# Patient Record
Sex: Female | Born: 1960 | Race: White | Hispanic: No | Marital: Married | Smoking: Current every day smoker
Health system: Southern US, Community
[De-identification: ages and names within clinical notes are randomized; demographics above are authoritative.]

## PROBLEM LIST (undated history)

## (undated) DIAGNOSIS — E7800 Pure hypercholesterolemia, unspecified: Secondary | ICD-10-CM

## (undated) HISTORY — PX: ABDOMINAL HYSTERECTOMY: SHX81

## (undated) HISTORY — PX: APPENDECTOMY: SHX54

## (undated) HISTORY — PX: TONSILLECTOMY: SUR1361

---

## 2013-01-08 DIAGNOSIS — F17210 Nicotine dependence, cigarettes, uncomplicated: Secondary | ICD-10-CM | POA: Insufficient documentation

## 2017-09-20 ENCOUNTER — Other Ambulatory Visit: Payer: Self-pay

## 2017-09-20 ENCOUNTER — Emergency Department (HOSPITAL_BASED_OUTPATIENT_CLINIC_OR_DEPARTMENT_OTHER)
Admission: EM | Admit: 2017-09-20 | Discharge: 2017-09-20 | Disposition: A | Discharge status: Home / Self Care | Payer: 59 | Attending: Emergency Medicine | Admitting: Emergency Medicine

## 2017-09-20 ENCOUNTER — Encounter (HOSPITAL_BASED_OUTPATIENT_CLINIC_OR_DEPARTMENT_OTHER): Payer: Self-pay | Admitting: Emergency Medicine

## 2017-09-20 DIAGNOSIS — S39012A Strain of muscle, fascia and tendon of lower back, initial encounter: Secondary | ICD-10-CM | POA: Diagnosis not present

## 2017-09-20 DIAGNOSIS — S3992XA Unspecified injury of lower back, initial encounter: Secondary | ICD-10-CM | POA: Diagnosis present

## 2017-09-20 DIAGNOSIS — F17200 Nicotine dependence, unspecified, uncomplicated: Secondary | ICD-10-CM | POA: Diagnosis not present

## 2017-09-20 DIAGNOSIS — Y999 Unspecified external cause status: Secondary | ICD-10-CM | POA: Insufficient documentation

## 2017-09-20 DIAGNOSIS — Y939 Activity, unspecified: Secondary | ICD-10-CM | POA: Diagnosis not present

## 2017-09-20 DIAGNOSIS — X58XXXA Exposure to other specified factors, initial encounter: Secondary | ICD-10-CM | POA: Diagnosis not present

## 2017-09-20 DIAGNOSIS — Y929 Unspecified place or not applicable: Secondary | ICD-10-CM | POA: Diagnosis not present

## 2017-09-20 MED ORDER — DIAZEPAM 5 MG PO TABS: 5.0000 mg | ORAL_TABLET | Freq: Two times a day (BID) | ORAL | 0 refills | Status: DC | PRN

## 2017-09-20 NOTE — Discharge Instructions (Signed)
TAKE IBUPROFEN 800MG  EVERY 8 HOURS FOR THE NEXT 3-4 DAYS. TAKE WITH FOOD. TAKE TYLENOL 1000MG  EVERY 6 TO 8 HOURS FOR PAIN. USE VALIUM AS NEEDED FOR MUSCLE RELAXANT, BE CAREFUL WITH THIS MEDICATION AS IT CAUSES SLEEPINESS. RETURN TO ER IF ANY LEG WEAKNESS/NUMBNESS, BOWEL/BLADDER INCONTINENCE, OR NUMBNESS IN GROIN.

## 2017-09-20 NOTE — ED Provider Notes (Signed)
[facility] EMERGENCY DEPARTMENT Provider Note   CSN: [id number] Arrival date & time: 09/20/17  [id number]     History   Chief Complaint Chief Complaint  Patient presents with  . Back Pain    HPI Jenny Bartlett is a 57 y.o. female.  57 year old female who presents with low back pain.  Patient has a distant history of back injury and states that once or twice a year she aggravates her back.  Her family has sent we moved here and yesterday she was moving boxes.  She reached for something and had sudden onset of pain in her lower back similar to previous episodes of back pain.  Pain is across her entire lower back, is crampy, and is severe at times.  She has been taking ibuprofen, Doan's without relief. She did try topical Salonpa's yesterday.  Today she was having some tingling in her right leg but this has resolved today.  She denies any leg weakness, saddle anesthesia, or bowel/bladder incontinence.  Pain is worse with certain movements.  She denies any fevers or recent illness.  No history of kidney problems or cancer.  No IV drug use.  The history is provided by the patient.  Back Pain      History reviewed. No pertinent past medical history.  There are no active problems to display for this patient.   Past Surgical History:  Procedure Laterality Date  . ABDOMINAL HYSTERECTOMY    . APPENDECTOMY    . TONSILLECTOMY       OB History   None      Home Medications    Prior to Admission medications   Medication Sig Start Date End Date Taking? Authorizing Provider  diazepam (VALIUM) 5 MG tablet Take 1 tablet (5 mg total) by mouth every 12 (twelve) hours as needed for muscle spasms. 09/20/17   Braleigh Massoud, Ambrose Finlandachel Morgan, MD    Family History No family history on file.  Social History Social History   Tobacco Use  . Smoking status: Current Every Day Smoker  . Smokeless tobacco: Never Used  Substance Use Topics  . Alcohol use: Not Currently    Frequency: Never  .  Drug use: Never     Allergies   Patient has no known allergies.   Review of Systems Review of Systems  Musculoskeletal: Positive for back pain.   All other systems reviewed and are negative except that which was mentioned in HPI   Physical Exam Updated Vital Signs BP (!) 153/81 (BP Location: Right Arm)   Pulse (!) 102   Temp 98.9 F (37.2 C) (Oral)   Resp 20   Ht 5\' 7"  (1.702 m)   Wt 104.3 kg (230 lb)   SpO2 99%   BMI 36.02 kg/m   Physical Exam  Constitutional: She is oriented to person, place, and time. She appears well-developed and well-nourished. No distress.  HENT:  Head: Normocephalic and atraumatic.  Moist mucous membranes  Eyes: Conjunctivae are normal.  Neck: Neck supple.  Cardiovascular: Normal rate, regular rhythm and normal heart sounds.  No murmur heard. Pulmonary/Chest: Effort normal and breath sounds normal.  Abdominal: Soft. Bowel sounds are normal. She exhibits no distension. There is no tenderness.  Musculoskeletal: She exhibits no edema.  Tenderness and tightness of b/l lumbar paraspinal muscles, no midline pain or stepoff  Neurological: She is alert and oriented to person, place, and time. She displays normal reflexes. No sensory deficit.  Fluent speech 5/5 strength BLE  Skin: Skin is warm and  dry.  Psychiatric: She has a normal mood and affect. Judgment normal.  Nursing note and vitals reviewed.    ED Treatments / Results  Labs (all labs ordered are listed, but only abnormal results are displayed) Labs Reviewed - No data to display  EKG None  Radiology No results found.  Procedures Procedures (including critical care time)  Medications Ordered in ED Medications - No data to display   Initial Impression / Assessment and Plan / ED Course  I have reviewed the triage vital signs and the nursing notes.      Patient demonstrates no lower extremity weakness, saddle anesthesia, bowel or bladder incontinence, or any other  neurologic deficits concerning for cauda equina. No fevers or other infectious symptoms to suggest by the patient's back pain is due to an infection. I have reviewed return precautions, including the development of any of these signs or symptoms, and the patient has voiced understanding. I reviewed supportive care instructions, including NSAIDs, early range of motion exercises, and PCP follow-up if symptoms do not improve for referral to physical therapy. Patient voiced understanding and was discharged in satisfactory condition.   Final Clinical Impressions(s) / ED Diagnoses   Final diagnoses:  Strain of lumbar region, initial encounter    ED Discharge Orders        Ordered    diazepam (VALIUM) 5 MG tablet  Every 12 hours PRN     09/20/17 1009       Luigi Stuckey, Ambrose [place], MD 09/20/17 1014

## 2017-09-20 NOTE — ED Triage Notes (Signed)
Low back pain since yesterday after reaching for something. Hx of back problems.

## 2017-10-30 ENCOUNTER — Ambulatory Visit: Payer: 59 | Admitting: Family Medicine

## 2017-10-30 DIAGNOSIS — Z0289 Encounter for other administrative examinations: Secondary | ICD-10-CM

## 2017-11-12 ENCOUNTER — Ambulatory Visit: Payer: 59 | Admitting: Medical

## 2018-05-28 ENCOUNTER — Other Ambulatory Visit: Payer: Self-pay | Admitting: Obstetrics and Gynecology

## 2018-05-28 DIAGNOSIS — R928 Other abnormal and inconclusive findings on diagnostic imaging of breast: Secondary | ICD-10-CM

## 2018-06-04 ENCOUNTER — Ambulatory Visit
Admission: RE | Admit: 2018-06-04 | Discharge: 2018-06-04 | Disposition: A | Discharge status: Home / Self Care | Payer: 59 | Source: Ambulatory Visit | Attending: Obstetrics and Gynecology | Admitting: Obstetrics and Gynecology

## 2018-06-04 ENCOUNTER — Other Ambulatory Visit: Payer: Self-pay

## 2018-06-04 ENCOUNTER — Other Ambulatory Visit: Payer: Self-pay | Admitting: Obstetrics and Gynecology

## 2018-06-04 DIAGNOSIS — R928 Other abnormal and inconclusive findings on diagnostic imaging of breast: Secondary | ICD-10-CM

## 2018-06-04 DIAGNOSIS — N63 Unspecified lump in unspecified breast: Secondary | ICD-10-CM

## 2018-12-07 ENCOUNTER — Other Ambulatory Visit: Payer: 59

## 2019-08-24 DIAGNOSIS — R195 Other fecal abnormalities: Secondary | ICD-10-CM | POA: Diagnosis not present

## 2019-08-24 DIAGNOSIS — Z9229 Personal history of other drug therapy: Secondary | ICD-10-CM | POA: Diagnosis not present

## 2019-10-28 DIAGNOSIS — K635 Polyp of colon: Secondary | ICD-10-CM | POA: Diagnosis not present

## 2019-10-28 DIAGNOSIS — R195 Other fecal abnormalities: Secondary | ICD-10-CM | POA: Diagnosis not present

## 2019-10-28 DIAGNOSIS — Z1211 Encounter for screening for malignant neoplasm of colon: Secondary | ICD-10-CM | POA: Diagnosis not present

## 2019-11-04 DIAGNOSIS — K635 Polyp of colon: Secondary | ICD-10-CM | POA: Diagnosis not present

## 2020-03-16 IMAGING — MG DIGITAL DIAGNOSTIC BILATERAL MAMMOGRAM WITH TOMO AND CAD
8 series · 8 of 24 positions shown · non-contrast
Comparison: Recent screening mammogram dated 05/07/2018.

CLINICAL DATA: Patient returns today to evaluate possible masses
within each breast which were identified on a recent screening
mammogram.

EXAM:
DIGITAL DIAGNOSTIC BILATERAL MAMMOGRAM WITH CAD AND TOMO
ULTRASOUND BILATERAL BREAST

[R MLO synth-2D]
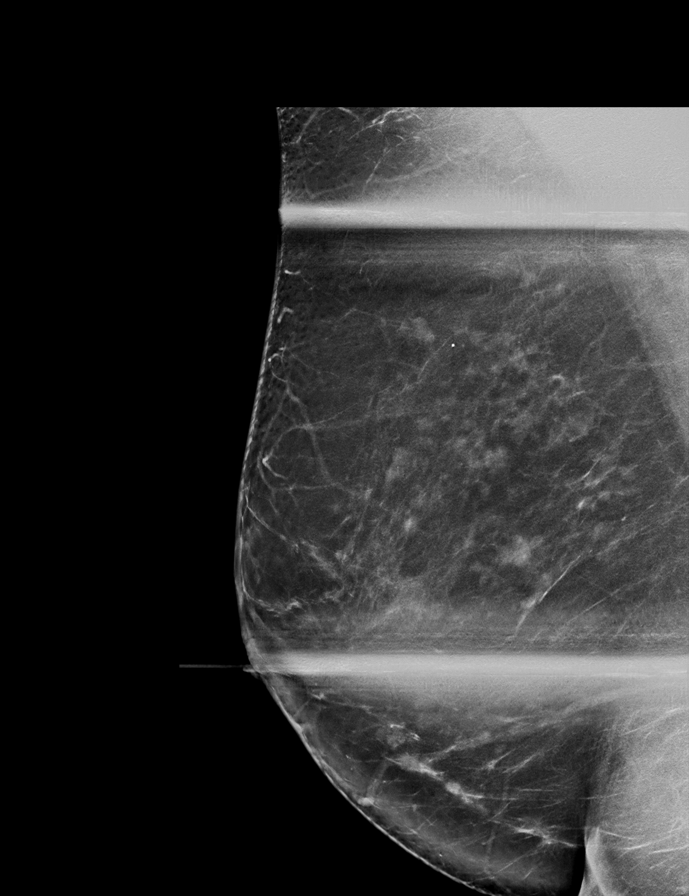

[R CC synth-2D]
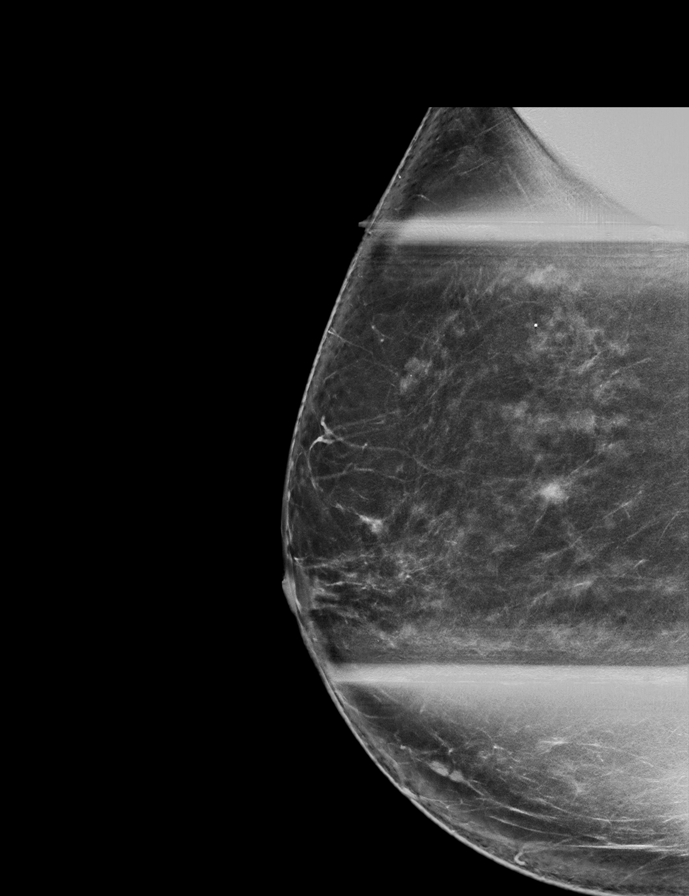

[L MLO synth-2D]
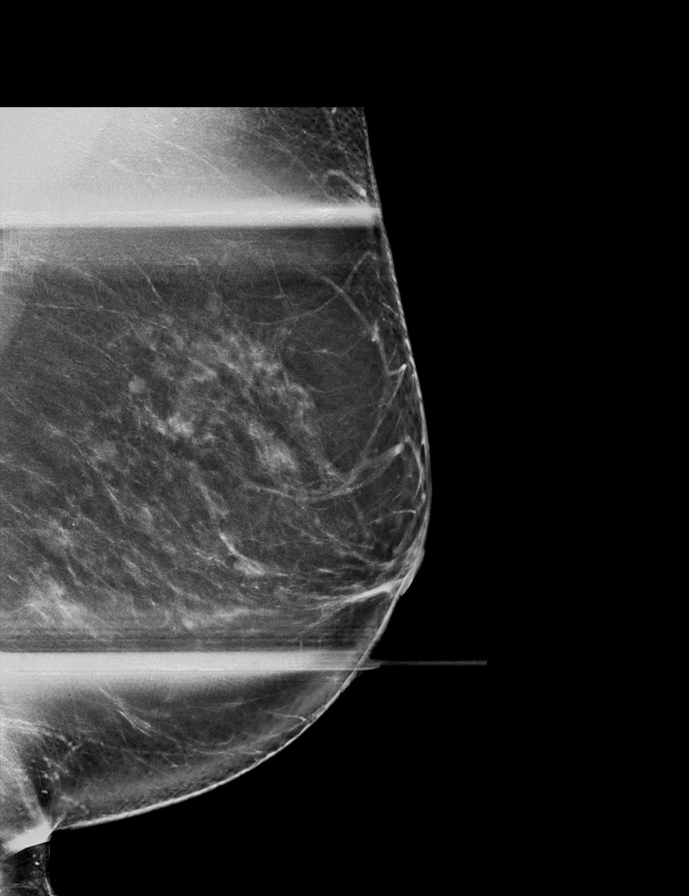

[L CC synth-2D]
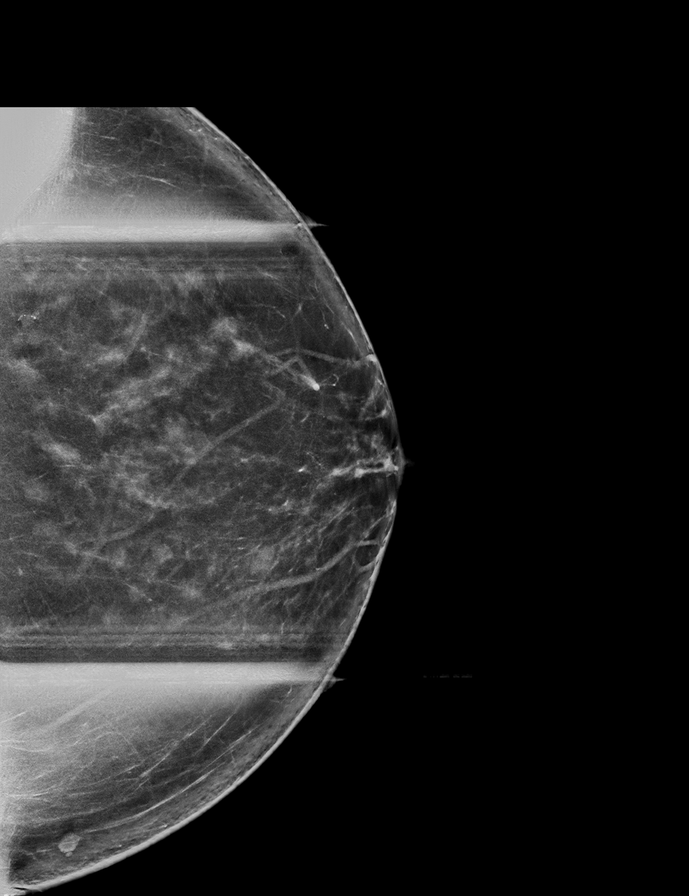

[L MLO tomo · tomo slice 45/90.0]
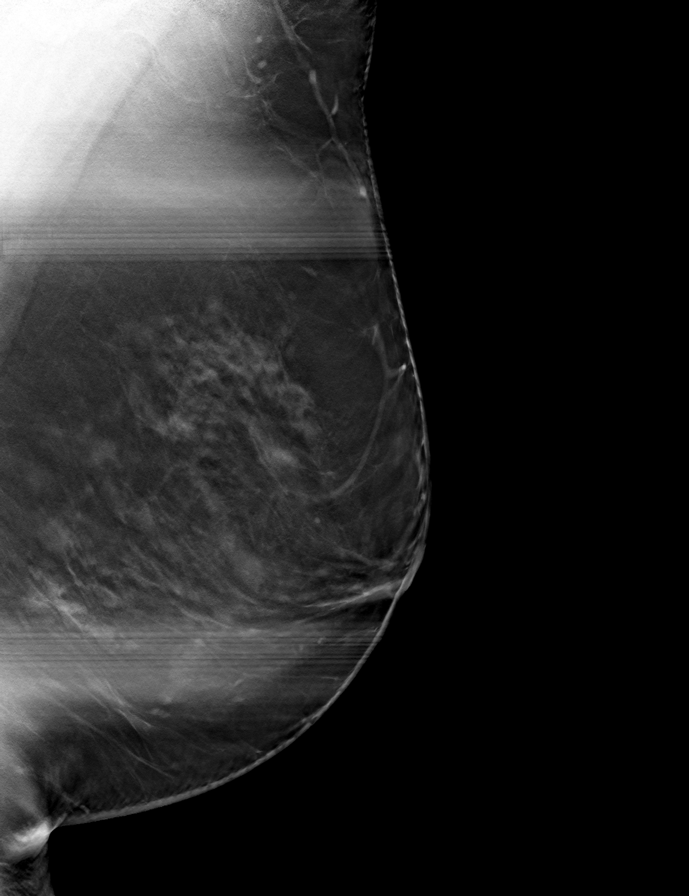

[L CC tomo · tomo slice 43/84.0]
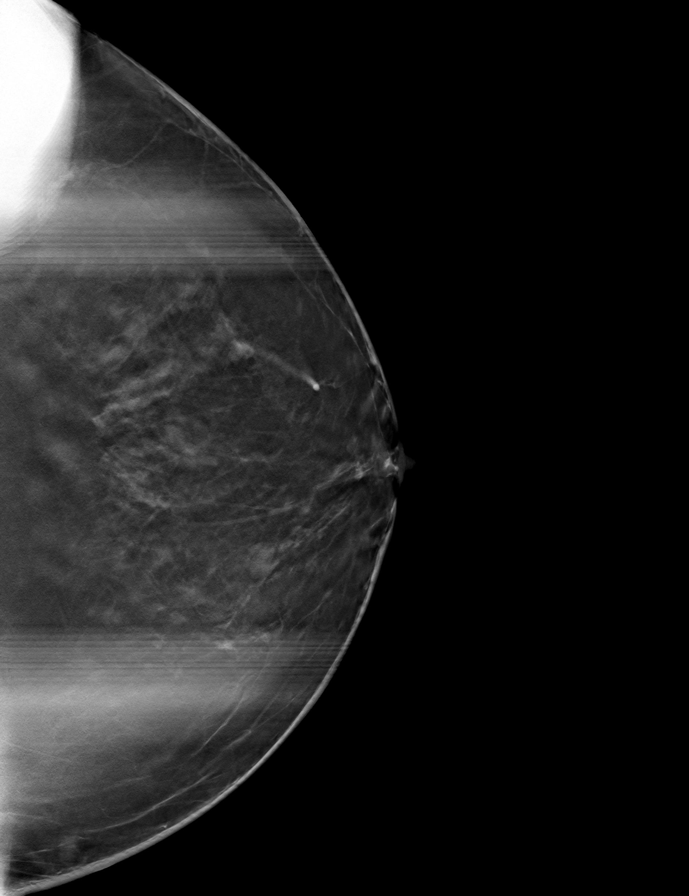

[R CC tomo · tomo slice 45/88.0]
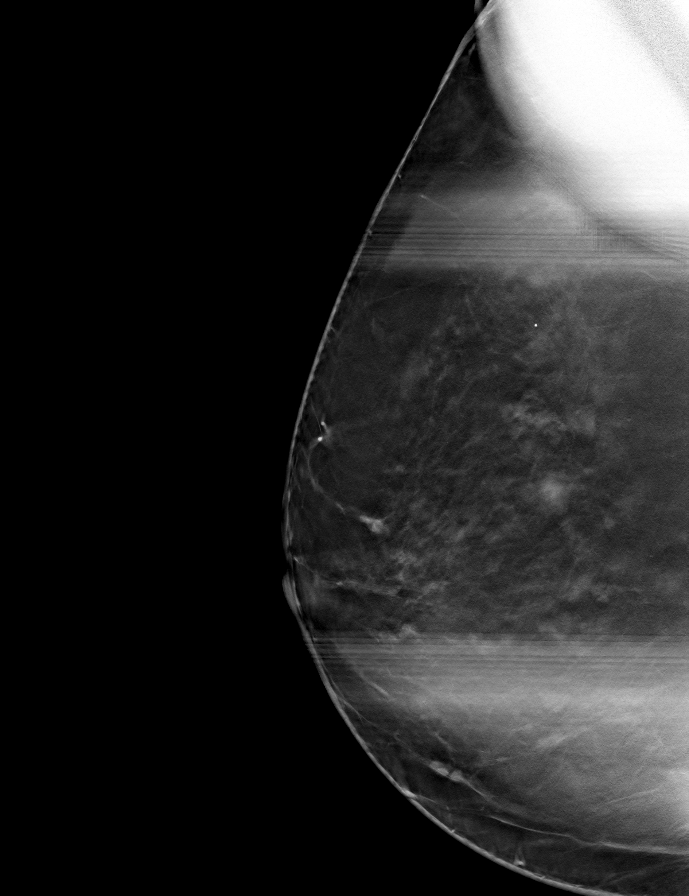

[R MLO tomo · tomo slice 47/92.0]
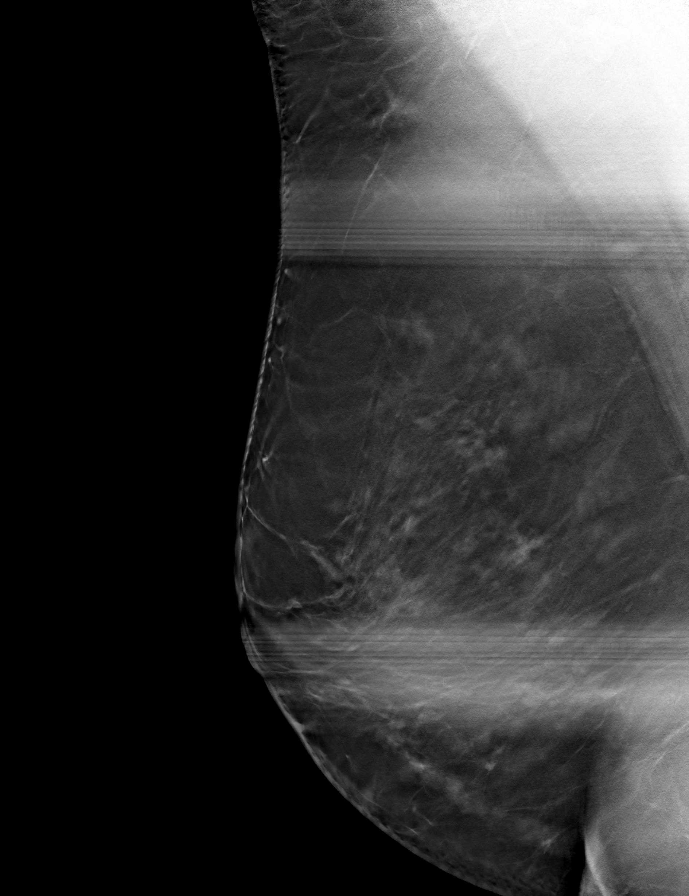

[8 of 24 positions shown; findings below may reference images not displayed]

Prior
outside mammograms were requested but not obtained.

ACR Breast Density Category b: There are scattered areas of
fibroglandular density.
FINDINGS: RIGHT breast: There is a mostly circumscribed mass confirmed within
the upper RIGHT breast, at middle depth, measuring approximately 7
mm greatest dimension. There are multiple other partially obscured
masses confirmed within the upper-outer quadrant of the RIGHT
breast.

LEFT breast: There is an irregular mass within the lower LEFT
breast, 6 o'clock axis region, at middle depth, measuring
approximately 1.2 cm greatest dimension. There are multiple other
partially obscured masses confirmed within the lower LEFT breast,
from middle to posterior depth.

Mammographic images were processed with CAD.

RIGHT breast: Targeted ultrasound is performed, showing a oval
circumscribed hypoechoic mass within the RIGHT breast at the 11-12
o'clock axis, 7 cm from the nipple, measuring 7 x 3 x 6 mm,
corresponding to the mammographic finding most suggestive of a
benign cluster of cysts.

There is an additional probably benign cluster of microcysts in the
RIGHT breast at the 9 o'clock axis, 5 cm from the nipple, measuring
9 x 3 x 4 mm.

There is a probably benign minimally complicated cyst within the
RIGHT breast at the 8 o'clock axis, 5 cm from the nipple, measuring
6 mm.

There is an additional hypoechoic mass within the RIGHT breast at
the 9 o'clock axis, 7 cm from the nipple, measures 3 mm, also most
likely a mildly complicated cyst.

LEFT breast: Targeted ultrasound is performed, showing a hypoechoic
mass in the LEFT breast at the 5 o'clock axis, 4 cm from the nipple,
measuring 1.2 x 0.5 x 0.8 cm, avascular, corresponding to the
mammographic finding, most suggestive of a benign cluster of cysts.

Additional adjacent hypoechoic masses are present within the LEFT
breast at the 5 o'clock axis, 4 cm from the nipple, at posterior
depth, measuring 11 x 4 mm and 6 x 3 mm respectively, also
corresponding to the mammographic findings, most suggestive of
benign complicated cysts with internal debris.
IMPRESSION: Probably benign cysts within each breast, corresponding to the
mammographic findings. Recommend follow-up bilateral diagnostic
mammogram and possible bilateral ultrasound in 6 months to ensure
stability.

RECOMMENDATION:
Bilateral diagnostic mammogram, and possible bilateral ultrasound,
in 6 months.

I have discussed the findings and recommendations with the patient.
Results were also provided in writing at the conclusion of the
visit. If applicable, a reminder letter will be sent to the patient
regarding the next appointment.

BI-RADS CATEGORY  3: Probably benign.

## 2020-09-03 DIAGNOSIS — H0012 Chalazion right lower eyelid: Secondary | ICD-10-CM | POA: Diagnosis not present

## 2020-09-10 DIAGNOSIS — J328 Other chronic sinusitis: Secondary | ICD-10-CM | POA: Diagnosis not present

## 2020-11-27 ENCOUNTER — Ambulatory Visit: Payer: BC Managed Care – PPO | Admitting: Family

## 2020-11-27 ENCOUNTER — Encounter: Payer: Self-pay | Admitting: Family

## 2020-11-27 ENCOUNTER — Other Ambulatory Visit: Payer: Self-pay

## 2020-11-27 VITALS — BP 130/70 | HR 101 | Temp 97.8°F | Ht 67.0 in | Wt 242.0 lb

## 2020-11-27 DIAGNOSIS — F339 Major depressive disorder, recurrent, unspecified: Secondary | ICD-10-CM | POA: Diagnosis not present

## 2020-11-27 DIAGNOSIS — R0683 Snoring: Secondary | ICD-10-CM | POA: Diagnosis not present

## 2020-11-27 DIAGNOSIS — G4730 Sleep apnea, unspecified: Secondary | ICD-10-CM

## 2020-11-27 DIAGNOSIS — Z720 Tobacco use: Secondary | ICD-10-CM

## 2020-11-27 DIAGNOSIS — N6019 Diffuse cystic mastopathy of unspecified breast: Secondary | ICD-10-CM | POA: Diagnosis not present

## 2020-11-27 MED ORDER — ESCITALOPRAM OXALATE 10 MG PO TABS: 10.0000 mg | ORAL_TABLET | Freq: Every day | ORAL | 0 refills | Status: DC

## 2020-11-27 NOTE — Progress Notes (Signed)
Jenny Bartlett is a 60 y.o. female with the following history as recorded in EpicCare:  There are no problems to display for this patient.   Current Outpatient Medications  Medication Sig Dispense Refill   escitalopram (LEXAPRO) 10 MG tablet Take 1 tablet (10 mg total) by mouth daily. 90 tablet 0   ibuprofen (ADVIL) 200 MG tablet Take 800 mg by mouth 2 (two) times daily.     No current facility-administered medications for this visit.    Allergies: Patient has no known allergies.  No past medical history on file.  Past Surgical History:  Procedure Laterality Date   ABDOMINAL HYSTERECTOMY     APPENDECTOMY     TONSILLECTOMY      No family history on file.  Social History   Tobacco Use   Smoking status: Every Day   Smokeless tobacco: Never  Substance Use Topics   Alcohol use: Not Currently    Subjective:  Presents today as a new patient; history of sleep apnea x 15-20 years; has never worn a CPAP; notes that snoring seems to be getting worse/ husband is hearing her stop breathing/ patient never feels rested;   Needs to get mammogram updated; Colonoscopy updated in 2021- repeat in 5 years;   Admits stress level has been high recently- daughter is deployed/ she is taking care of her 9 yo granddaughter in the interim; would like to try a medication;limited response to Zoloft/ Wellbutrin;     Objective:  Vitals:   11/27/20 1012  BP: 130/70  Pulse: (!) 101  Temp: 97.8 F (36.6 C)  TempSrc: Oral  SpO2: 94%  Weight: 242 lb (109.8 kg)  Height: 5\' 7"  (1.702 m)    General: Well developed, well nourished, in no acute distress  Skin : Warm and dry.  Head: Normocephalic and atraumatic  Lungs: Respirations unlabored; clear to auscultation bilaterally without wheeze, rales, rhonchi  CVS exam: normal rate and regular rhythm.  Abdomen: Soft; nontender; nondistended; normoactive bowel sounds; no masses or hepatosplenomegaly  Musculoskeletal: No deformities; no active joint  inflammation  Extremities: No edema, cyanosis, clubbing  Vessels: Symmetric bilaterally  Neurologic: Alert and oriented; speech intact; face symmetrical; moves all extremities well; CNII-XII intact without focal deficit   Assessment:  1. Sleep apnea, unspecified type   2. Loud snoring   3. Fibrocystic breast disease (FCBD), unspecified laterality   4. Depression, recurrent (HCC)   5. Tobacco abuse     Plan:  Refer for sleep study; 3.   Order for diagnostic mammogram- this was due in 2020 and follow up not completed; radiology may want to change back to screening; 4.   Refer to counseling; Rx for Lexapro 10 mg daily; 5.   She is trying to cut back; will need to discuss lung cancer CT with her at next OV- has smoked 28 years and would qualify;  This visit occurred during the SARS-CoV-2 public health emergency.  Safety protocols were in place, including screening questions prior to the visit, additional usage of staff PPE, and extensive cleaning of exam room while observing appropriate contact time as indicated for disinfecting solutions.    Return in about 4 weeks (around 12/25/2020) for fasting CPE.  Orders Placed This Encounter  Procedures   MM Digital Diagnostic Bilat    Standing Status:   Future    Standing Expiration Date:   11/27/2021    Order Specific Question:   Reason for Exam (SYMPTOM  OR DIAGNOSIS REQUIRED)    Answer:   History  of fibrocystic breasts/ abnormal mammogram 06/2018    Order Specific Question:   Is the patient pregnant?    Answer:   No    Order Specific Question:   Preferred imaging location?    Answer:   [facility]   Ambulatory referral to Neurology    Referral Priority:   Routine    Referral Type:   Consultation    Referral Reason:   Specialty Services Required    Requested Specialty:   Neurology    Number of Visits Requested:   1   Ambulatory referral to Psychology    Referral Priority:   Routine    Referral Type:   Psychiatric    Referral Reason:    Specialty Services Required    Requested Specialty:   Psychology    Number of Visits Requested:   1    Requested Prescriptions   Signed Prescriptions Disp Refills   escitalopram (LEXAPRO) 10 MG tablet 90 tablet 0    Sig: Take 1 tablet (10 mg total) by mouth daily.

## 2020-11-28 ENCOUNTER — Other Ambulatory Visit: Payer: Self-pay | Admitting: Family

## 2020-11-28 DIAGNOSIS — N63 Unspecified lump in unspecified breast: Secondary | ICD-10-CM

## 2020-11-28 DIAGNOSIS — N6019 Diffuse cystic mastopathy of unspecified breast: Secondary | ICD-10-CM

## 2021-01-14 ENCOUNTER — Other Ambulatory Visit: Payer: BC Managed Care – PPO

## 2021-01-14 ENCOUNTER — Inpatient Hospital Stay: Admission: RE | Admit: 2021-01-14 | Payer: BC Managed Care – PPO | Source: Ambulatory Visit

## 2021-02-05 ENCOUNTER — Ambulatory Visit: Payer: Self-pay

## 2021-02-05 ENCOUNTER — Ambulatory Visit: Payer: BC Managed Care – PPO | Admitting: Orthopaedic Surgery

## 2021-02-05 ENCOUNTER — Other Ambulatory Visit: Payer: Self-pay

## 2021-02-05 DIAGNOSIS — M25552 Pain in left hip: Secondary | ICD-10-CM | POA: Diagnosis not present

## 2021-02-05 DIAGNOSIS — M545 Low back pain, unspecified: Secondary | ICD-10-CM | POA: Diagnosis not present

## 2021-02-05 MED ORDER — DICLOFENAC SODIUM 75 MG PO TBEC: 75.0000 mg | DELAYED_RELEASE_TABLET | Freq: Two times a day (BID) | ORAL | 2 refills | Status: DC | PRN

## 2021-02-05 MED ORDER — METHOCARBAMOL 500 MG PO TABS: 500.0000 mg | ORAL_TABLET | Freq: Two times a day (BID) | ORAL | 0 refills | Status: DC | PRN

## 2021-02-05 MED ORDER — PREDNISONE 10 MG (21) PO TBPK: ORAL_TABLET | ORAL | 0 refills | Status: DC

## 2021-02-05 NOTE — Progress Notes (Signed)
Office Visit Note   Patient: Jenny Bartlett           Date of Birth: 03-14-1960           MRN: [id number] Visit Date: 02/05/2021              Requested by: Olive Bass, FNP [address] [place],  [place] [address] PCP: Olive Bass, FNP   Assessment & Plan: Visit Diagnoses:  1. Low back pain, unspecified back pain laterality, unspecified chronicity, unspecified whether sciatica present   2. 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  At this point, I discussed starting the patient on a steroid taper and muscle relaxers followed by anti-inflammatories as needed.  I believe she is in too much pain to initiate physical therapy at this point in time.  If her symptoms have not improved she will call us and we we will likely order an MRI of the lumbar spine.  Otherwise, follow-up with [place] as needed.  Follow-Up Instructions: Return if symptoms worsen or fail to improve.   Orders:  Orders Placed This Encounter  Procedures   XR Pelvis 1-2 Views   XR Lumbar Spine 2-3 Views   Meds ordered this encounter  Medications   predniSONE (STERAPRED UNI-PAK 21 TAB) 10 MG (21) TBPK tablet    Sig: Take as directed.  Do not take nsaids while on this medication    Dispense:  21 tablet    Refill:  0   diclofenac (VOLTAREN) 75 MG EC tablet    Sig: Take 1 tablet (75 mg total) by mouth 2 (two) times daily as needed. May take as needed once finished with steroid taper    Dispense:  60 tablet    Refill:  2   methocarbamol (ROBAXIN) 500 MG tablet    Sig: Take 1 tablet (500 mg total) by mouth 2 (two) times daily as needed.    Dispense:  20 tablet    Refill:  0       Procedures: No procedures performed   Clinical Data: No additional findings.   Subjective: Chief Complaint  Patient presents with   Lower Back - Pain    HPI patient is a pleasant 60 year old female who comes in today  with chronic intermittent bilateral low back pain with right lower extremity radiculopathy.  This most recent flareup occurred approximately 2 weeks ago.  The pain is across the entire lower back and radiates down the right leg.  This is where she does have associated numbness.  Pain is worse with sitting and lying down.  She has been using ice and ibuprofen without relief.  She does note a very remote epidural steroid injection many years ago which did seem to help.  Review of Systems as detailed in HPI.  All others reviewed and are negative.   Objective: Vital Signs: There were no vitals taken for this visit.  Physical Exam well-developed well-nourished female no acute distress.  Alert and oriented x3.  Ortho Exam lumbar exam shows no spinous tenderness.  She has mild to moderate right-sided paraspinous tenderness.  Increased pain with lumbar extension and rotation to the right.  Positive straight leg raise on the right.  No focal weakness.  She is neurovascular intact distally.  Specialty Comments:  No specialty comments available.  Imaging: XR Lumbar Spine 2-3 Views  Result Date: 02/05/2021 Mild to moderate multilevel degenerative  changes  XR Pelvis 1-2 Views  Result Date: 02/05/2021 Mild degenerative changes    PMFS History: There are no problems to display for this patient.  No past medical history on file.  No family history on file.  Past Surgical History:  Procedure Laterality Date   ABDOMINAL HYSTERECTOMY     APPENDECTOMY     TONSILLECTOMY     Social History   Occupational History   Not on file  Tobacco Use   Smoking status: Every Day   Smokeless tobacco: Never  Substance and Sexual Activity   Alcohol use: Not Currently   Drug use: Never   Sexual activity: Not on file

## 2021-02-08 ENCOUNTER — Encounter: Payer: Self-pay | Admitting: Family

## 2021-02-08 ENCOUNTER — Ambulatory Visit: Payer: BC Managed Care – PPO | Admitting: Family

## 2021-02-08 VITALS — BP 130/70 | HR 86 | Temp 97.8°F | Ht 67.0 in | Wt 238.6 lb

## 2021-02-08 DIAGNOSIS — Z13220 Encounter for screening for lipoid disorders: Secondary | ICD-10-CM

## 2021-02-08 DIAGNOSIS — F17200 Nicotine dependence, unspecified, uncomplicated: Secondary | ICD-10-CM

## 2021-02-08 DIAGNOSIS — Z1159 Encounter for screening for other viral diseases: Secondary | ICD-10-CM | POA: Diagnosis not present

## 2021-02-08 DIAGNOSIS — Z23 Encounter for immunization: Secondary | ICD-10-CM

## 2021-02-08 DIAGNOSIS — Z0000 Encounter for general adult medical examination without abnormal findings: Secondary | ICD-10-CM

## 2021-02-08 DIAGNOSIS — Z720 Tobacco use: Secondary | ICD-10-CM | POA: Diagnosis not present

## 2021-02-08 LAB — COMPREHENSIVE METABOLIC PANEL
ALT: 18 U/L (ref 0–35)
AST: 13 U/L (ref 0–37)
Albumin: 4.5 g/dL (ref 3.5–5.2)
Alkaline Phosphatase: 71 U/L (ref 39–117)
BUN: 17 mg/dL (ref 6–23)
CO2: 29 mEq/L (ref 19–32)
Calcium: 9.9 mg/dL (ref 8.4–10.5)
Chloride: 104 mEq/L (ref 96–112)
Creatinine, Ser: 0.75 mg/dL (ref 0.40–1.20)
GFR: 86.54 mL/min (ref >60.00)
Glucose, Bld: 98 mg/dL (ref 70–99)
Potassium: 4.8 mEq/L (ref 3.5–5.1)
Sodium: 140 mEq/L (ref 135–145)
Total Bilirubin: 0.4 mg/dL (ref 0.2–1.2)
Total Protein: 7 g/dL (ref 6.0–8.3)

## 2021-02-08 LAB — CBC WITH DIFFERENTIAL/PLATELET
Basophils Absolute: 0 10*3/uL (ref 0.0–0.1)
Basophils Relative: 0.2 % (ref 0.0–3.0)
Eosinophils Absolute: 0 10*3/uL (ref 0.0–0.7)
Eosinophils Relative: 0.1 % (ref 0.0–5.0)
HCT: 41.9 % (ref 36.0–46.0)
Hemoglobin: 14.2 g/dL (ref 12.0–15.0)
Lymphocytes Relative: 13.3 % (ref 12.0–46.0)
Lymphs Abs: 1.3 10*3/uL (ref 0.7–4.0)
MCHC: 33.8 g/dL (ref 30.0–36.0)
MCV: 93.3 fl (ref 78.0–100.0)
Monocytes Absolute: 0.4 10*3/uL (ref 0.1–1.0)
Monocytes Relative: 3.9 % (ref 3.0–12.0)
Neutro Abs: 8.3 10*3/uL — ABNORMAL HIGH (ref 1.4–7.7)
Neutrophils Relative %: 82.5 % — ABNORMAL HIGH (ref 43.0–77.0)
Platelets: 365 10*3/uL (ref 150.0–400.0)
RBC: 4.5 Mil/uL (ref 3.87–5.11)
RDW: 13.8 % (ref 11.5–15.5)
WBC: 10 10*3/uL (ref 4.0–10.5)

## 2021-02-08 LAB — LIPID PANEL
Cholesterol: 226 mg/dL — ABNORMAL HIGH (ref 0–200)
HDL: 50.8 mg/dL (ref >39.00)
LDL Cholesterol: 146 mg/dL — ABNORMAL HIGH (ref 0–99)
NonHDL: 174.99
Total CHOL/HDL Ratio: 4
Triglycerides: 144 mg/dL (ref 0.0–149.0)
VLDL: 28.8 mg/dL (ref 0.0–40.0)

## 2021-02-08 LAB — TSH: TSH: 0.22 u[IU]/mL — ABNORMAL LOW (ref 0.35–5.50)

## 2021-02-08 MED ORDER — ESCITALOPRAM OXALATE 10 MG PO TABS: 10.0000 mg | ORAL_TABLET | Freq: Every day | ORAL | 3 refills | Status: DC

## 2021-02-08 NOTE — Patient Instructions (Signed)
Please call to get your mammogram re-scheduled; they will let me know if you need a new order.

## 2021-02-08 NOTE — Progress Notes (Signed)
Jenny Bartlett is a 60 y.o. female with the following history as recorded in EpicCare:  There are no problems to display for this patient.   Current Outpatient Medications  Medication Sig Dispense Refill   ibuprofen (ADVIL) 200 MG tablet Take 800 mg by mouth 2 (two) times daily.     methocarbamol (ROBAXIN) 500 MG tablet Take 1 tablet (500 mg total) by mouth 2 (two) times daily as needed. 20 tablet 0   predniSONE (STERAPRED UNI-PAK 21 TAB) 10 MG (21) TBPK tablet Take as directed.  Do not take nsaids while on this medication 21 tablet 0   diclofenac (VOLTAREN) 75 MG EC tablet Take 1 tablet (75 mg total) by mouth 2 (two) times daily as needed. May take as needed once finished with steroid taper (Patient not taking: Reported on 02/08/2021) 60 tablet 2   escitalopram (LEXAPRO) 10 MG tablet Take 1 tablet (10 mg total) by mouth daily. 90 tablet 3   No current facility-administered medications for this visit.    Allergies: Patient has no known allergies.  No past medical history on file.  Past Surgical History:  Procedure Laterality Date   ABDOMINAL HYSTERECTOMY     APPENDECTOMY     TONSILLECTOMY      No family history on file.  Social History   Tobacco Use   Smoking status: Every Day   Smokeless tobacco: Never  Substance Use Topics   Alcohol use: Not Currently    Subjective:  Presents for yearly CPE; still needs to get mammogram; working with orthopedist regarding chronic back pain;  Good response to Lexapro- requesting refill;  Had to cancel her mammogram in November- planning to re-schedule; Scheduled for sleep study consult next week;   Review of Systems  Constitutional: Negative.   HENT: Negative.    Eyes: Negative.   Respiratory: Negative.    Cardiovascular: Negative.   Gastrointestinal: Negative.   Genitourinary: Negative.   Musculoskeletal: Negative.   Skin: Negative.   Neurological: Negative.   Endo/Heme/Allergies: Negative.   Psychiatric/Behavioral: Negative.         Objective:  Vitals:   02/08/21 0825  BP: 130/70  Pulse: 86  Temp: 97.8 F (36.6 C)  TempSrc: Oral  SpO2: 96%  Weight: 238 lb 9.6 oz (108.2 kg)  Height: $Remove'5\' 7"'FyMObgW$  (1.702 m)    General: Well developed, well nourished, in no acute distress  Skin : Warm and dry.  Head: Normocephalic and atraumatic  Eyes: Sclera and conjunctiva clear; pupils round and reactive to light; extraocular movements intact  Ears: External normal; canals clear; tympanic membranes normal  Oropharynx: Pink, supple. No suspicious lesions  Neck: Supple without thyromegaly, adenopathy  Lungs: Respirations unlabored; clear to auscultation bilaterally without wheeze, rales, rhonchi  CVS exam: normal rate and regular rhythm.  Abdomen: Soft; nontender; nondistended; normoactive bowel sounds; no masses or hepatosplenomegaly  Musculoskeletal: No deformities; no active joint inflammation  Extremities: No edema, cyanosis, clubbing  Vessels: Symmetric bilaterally  Neurologic: Alert and oriented; speech intact; face symmetrical; moves all extremities well; CNII-XII intact without focal deficit   Assessment:  1. PE (physical exam), annual   2. Smoker   3. Tobacco abuse   4. Need for prophylactic vaccination against Streptococcus pneumoniae (pneumococcus)   5. Lipid screening   6. Need for hepatitis C screening test     Plan:  Age appropriate preventive healthcare needs addressed; encouraged regular eye doctor and dental exams; encouraged regular exercise; will update labs and refills as needed today; follow-up to be determined;  Order for lung cancer screening; encouraged to her her mammogram updated;  Good response to Lexapro- refill updated;  Prevnar 20 given today; she defers flu shot;  Follow up in 1 year, sooner prn.   This visit occurred during the SARS-CoV-2 public health emergency.  Safety protocols were in place, including screening questions prior to the visit, additional usage of staff PPE, and extensive  cleaning of exam room while observing appropriate contact time as indicated for disinfecting solutions.    No follow-ups on file.  Orders Placed This Encounter  Procedures   Pneumococcal conjugate vaccine 20-valent (Prevnar 20)   CBC with Differential/Platelet   Comp Met (CMET)   Lipid panel   TSH   Hepatitis C Antibody   Ambulatory Referral Lung Cancer Screening [facility]    Referral Priority:   Routine    Referral Type:   Consultation    Referral Reason:   Specialty Services Required    Number of Visits Requested:   1    Requested Prescriptions   Signed Prescriptions Disp Refills   escitalopram (LEXAPRO) 10 MG tablet 90 tablet 3    Sig: Take 1 tablet (10 mg total) by mouth daily.

## 2021-02-11 ENCOUNTER — Other Ambulatory Visit: Payer: Self-pay | Admitting: Family

## 2021-02-11 ENCOUNTER — Ambulatory Visit: Payer: BC Managed Care – PPO | Admitting: Orthopedic Surgery

## 2021-02-11 DIAGNOSIS — R7989 Other specified abnormal findings of blood chemistry: Secondary | ICD-10-CM

## 2021-02-11 LAB — HEPATITIS C ANTIBODY
Hepatitis C Ab: NONREACTIVE
SIGNAL TO CUT-OFF: <0.02 (ref <1.00)

## 2021-02-14 ENCOUNTER — Encounter: Payer: Self-pay | Admitting: Orthopaedic Surgery

## 2021-02-14 ENCOUNTER — Institutional Professional Consult (permissible substitution): Payer: BC Managed Care – PPO | Admitting: Neurology

## 2021-02-15 ENCOUNTER — Other Ambulatory Visit: Payer: Self-pay

## 2021-02-15 ENCOUNTER — Ambulatory Visit
Admission: RE | Admit: 2021-02-15 | Discharge: 2021-02-15 | Disposition: A | Discharge status: Home / Self Care | Payer: BC Managed Care – PPO | Source: Ambulatory Visit | Attending: Emergency Medicine | Admitting: Emergency Medicine

## 2021-02-15 VITALS — BP 129/83 | HR 95 | Temp 98.2°F | Resp 18

## 2021-02-15 DIAGNOSIS — N390 Urinary tract infection, site not specified: Secondary | ICD-10-CM | POA: Insufficient documentation

## 2021-02-15 DIAGNOSIS — B3731 Acute candidiasis of vulva and vagina: Secondary | ICD-10-CM | POA: Insufficient documentation

## 2021-02-15 LAB — POCT URINALYSIS DIP (MANUAL ENTRY)
Bilirubin, UA: NEGATIVE
Blood, UA: NEGATIVE
Glucose, UA: NEGATIVE mg/dL
Ketones, POC UA: NEGATIVE mg/dL
Leukocytes, UA: NEGATIVE
Nitrite, UA: POSITIVE — AB
Protein Ur, POC: NEGATIVE mg/dL
Spec Grav, UA: 1.025 (ref 1.010–1.025)
Urobilinogen, UA: 0.2 E.U./dL
pH, UA: 5.5 (ref 5.0–8.0)

## 2021-02-15 MED ORDER — FLUCONAZOLE 150 MG PO TABS
ORAL_TABLET | ORAL | 0 refills | Status: DC
Start: 2021-02-15 — End: 2021-11-12

## 2021-02-15 NOTE — ED Provider Notes (Signed)
[facility]    CSN: [id number] Arrival date & time: 02/15/21  1228    HISTORY   Chief Complaint  Patient presents with   Dysuria   HPI Jenny Bartlett is a 60 y.o. female. Ptient presents to [facility] for evaluation of dysuria, burning with urination, frequency and urgency.  Patient states hx of back pain, states no change from normal.  Denies abdominal pain or fever.  Patient states she was recently seen by her primary care provider for routine lab work, states she had a mildly elevated white blood cell count.  The history is provided by the patient.  History reviewed. No pertinent past medical history. There are no problems to display for this patient.  Past Surgical History:  Procedure Laterality Date   ABDOMINAL HYSTERECTOMY     APPENDECTOMY     TONSILLECTOMY     OB History   No obstetric history on file.    Home Medications    Prior to Admission medications   Medication Sig Start Date End Date Taking? Authorizing Provider  fluconazole (DIFLUCAN) 150 MG tablet Take 1 tablet on day 4 of antibiotics.  Take second tablet 3 days later. 02/15/21  Yes Theadora Rama Scales, PA-C  escitalopram (LEXAPRO) 10 MG tablet Take 1 tablet (10 mg total) by mouth daily. 02/08/21   Olive Bass, FNP  ibuprofen (ADVIL) 200 MG tablet Take 800 mg by mouth 2 (two) times daily.    [provider]  methocarbamol (ROBAXIN) 500 MG tablet Take 1 tablet (500 mg total) by mouth 2 (two) times daily as needed. 02/05/21   Cristie Hem, PA-C   Family History Family History  Problem Relation Age of Onset   Cancer Mother    Stomach cancer Mother    Hypertension Father    Colon cancer Father    Social History Social History   Tobacco Use   Smoking status: Every Day   Smokeless tobacco: Never  Substance Use Topics   Alcohol use: Not Currently   Drug use: Never   Allergies   Patient has no known allergies.  Review of Systems Review of Systems Pertinent  findings noted in history of present illness.   Physical Exam Triage Vital Signs ED Triage Vitals  Enc Vitals Group     BP 12/28/20 0827 (!) 147/82     Pulse Rate 12/28/20 0827 72     Resp 12/28/20 0827 18     Temp 12/28/20 0827 98.3 F (36.8 C)     Temp Source 12/28/20 0827 Oral     SpO2 12/28/20 0827 98 %     Weight --      Height --      Head Circumference --      Peak Flow --      Pain Score 12/28/20 0826 5     Pain Loc --      Pain Edu? --      Excl. in GC? --   No data found.  Updated Vital Signs BP 129/83 (BP Location: Right Arm)    Pulse 95    Temp 98.2 F (36.8 C) (Oral)    Resp 18    SpO2 94%   Physical Exam Vitals and nursing note reviewed.  Constitutional:      General: She is not in acute distress.    Appearance: Normal appearance. She is not ill-appearing.  HENT:     Head: Normocephalic and atraumatic.  Eyes:     General: Lids are normal.  Right eye: No discharge.        Left eye: No discharge.     Extraocular Movements: Extraocular movements intact.     Conjunctiva/sclera: Conjunctivae normal.     Right eye: Right conjunctiva is not injected.     Left eye: Left conjunctiva is not injected.  Neck:     Trachea: Trachea and phonation normal.  Cardiovascular:     Rate and Rhythm: Normal rate and regular rhythm.     Pulses: Normal pulses.     Heart sounds: Normal heart sounds. No murmur heard.   No friction rub. No gallop.  Pulmonary:     Effort: Pulmonary effort is normal. No accessory muscle usage, prolonged expiration or respiratory distress.     Breath sounds: Normal breath sounds. No stridor, decreased air movement or transmitted upper airway sounds. No decreased breath sounds, wheezing, rhonchi or rales.  Chest:     Chest wall: No tenderness.  Abdominal:     General: Abdomen is flat. Bowel sounds are normal. There is no distension.     Palpations: Abdomen is soft.     Tenderness: There is abdominal tenderness in the suprapubic area. There  is no right CVA tenderness or left CVA tenderness.     Hernia: No hernia is present.  Musculoskeletal:        General: Normal range of motion.     Cervical back: Normal range of motion and neck supple. Normal range of motion.  Lymphadenopathy:     Cervical: No cervical adenopathy.  Skin:    General: Skin is warm and dry.     Findings: No erythema or rash.  Neurological:     General: No focal deficit present.     Mental Status: She is alert and oriented to person, place, and time.  Psychiatric:        Mood and Affect: Mood normal.        Behavior: Behavior normal.    Visual Acuity Right Eye Distance:   Left Eye Distance:   Bilateral Distance:    Right Eye Near:   Left Eye Near:    Bilateral Near:     UC Couse / Diagnostics / Procedures:    EKG  Radiology No results found.  Procedures Procedures (including critical care time)  UC Diagnoses / Final Clinical Impressions(s)   I have reviewed the triage vital signs and the nursing notes.  Pertinent labs & imaging results that were available during my care of the patient were reviewed by me and considered in my medical decision making (see chart for details).    Final diagnoses:  Acute lower urinary tract infection  Vulvovaginal candidiasis   Urine dip today was nitrite positive.  Urine culture will be performed per our protocol.  Patient advised that they will be contacted with results and that adjustments to treatment will be provided as indicated based on the results.  Diflucan was provided for inevitable vaginal yeast infection secondary to antibiotics.  Patient was advised of possibility that urine culture results may be negative if sample provided was obtained late in the day causing urine to be more diluted.  Patient was advised that if antibiotics were effective after the first 24 to 36 hours, despite negative urine culture result, it is recommended that they complete the full course as prescribed.  Return precautions  advised.  Drug allergies reviewed, all questions addressed.  ED Prescriptions     Medication Sig Dispense Auth. Provider   fluconazole (DIFLUCAN) 150 MG tablet Take  1 tablet on day 4 of antibiotics.  Take second tablet 3 days later. 2 tablet Theadora Rama Scales, PA-C      PDMP not reviewed this encounter.  Pending results:  Labs Reviewed  POCT URINALYSIS DIP (MANUAL ENTRY) - Abnormal; Notable for the following components:      Result Value   Color, UA orange (*)    Nitrite, UA Positive (*)    All other components within normal limits  URINE CULTURE    Medications Ordered in UC: Medications - No data to display  Disposition Upon Discharge:  Condition: stable for discharge home Home: take medications as prescribed; routine discharge instructions as discussed; follow up as advised.  Patient presented with an acute illness with associated systemic symptoms and significant discomfort requiring urgent management. In my opinion, this is a condition that a prudent lay person (someone who possesses an average knowledge of health and medicine) may potentially expect to result in complications if not addressed urgently such as respiratory distress, impairment of bodily function or dysfunction of bodily organs.   Routine symptom specific, illness specific and/or disease specific instructions were discussed with the patient and/or caregiver at length.   As such, the patient has been evaluated and assessed, work-up was performed and treatment was provided in alignment with urgent care protocols and evidence based medicine.  Patient/parent/caregiver has been advised that the patient may require follow up for further testing and treatment if the symptoms continue in spite of treatment, as clinically indicated and appropriate.  Patient/parent/caregiver has been advised to return to the [facility] or PCP in 3-5 days if no better; to PCP or the Emergency Department if new signs and symptoms develop, or if  the current signs or symptoms continue to change or worsen for further workup, evaluation and treatment as clinically indicated and appropriate  The patient will follow up with their current PCP if and as advised. If the patient does not currently have a PCP we will assist them in obtaining one.   The patient may need specialty follow up if the symptoms continue, in spite of conservative treatment and management, for further workup, evaluation, consultation and treatment as clinically indicated and appropriate.  Patient/parent/caregiver verbalized understanding and agreement of plan as discussed.  All questions were addressed during visit.  Please see discharge instructions below for further details of plan.  Discharge Instructions:   Discharge Instructions      The urinalysis that we performed today was abnormal.  Urine culture will be performed per our protocol.  The results of urine culture should be available in the next 3 to 5 days and will be posted to your MyChart account.  If there are any abnormal results, you will be contacted by phone and if further treatment is needed, this will be provided for you.  You were advised to begin antibiotics today because you are having active symptoms of a urinary tract infection and there were nitrites present in your urinalysis today.  It is important that you complete the course of antibiotics as prescribed despite the results of your urine culture.  I have sent a prescription for Bactrim to your pharmacy, please take 1 tablet twice daily for the next 5 days.  The most common reason for a negative urine culture, despite having active symptoms of urinary tract infection, is that a truly reliable urine sample can only be obtained with the first urination of the day.  That early morning urine sample is very concentrated so it is most  likely to have a significant amount of bacteria, enough to provide a positive urine culture.  Throughout the day, as you  drink fluids and consume foods, your urine becomes more diluted.  Diluted urine decreases the likelihood of a negative urine culture.  Therefore, if you begin to have significant relief of your symptoms when you begin the antibiotics but receive a phone call stating that your urine culture was negative, please trust the improvement of your symptoms as a sign that the antibiotics have been effective and that you should complete the entire course.      This office note has been dictated using Teaching laboratory technician.  Unfortunately, and despite my best efforts, this method of dictation can sometimes lead to occasional typographical or grammatical errors.  I apologize in advance if this occurs.      Theadora Rama Scales, PA-C 02/15/21 1553

## 2021-02-15 NOTE — ED Triage Notes (Signed)
Ptient presents to [facility] for evaluation of dysuria, burning with urination, frequency and urgency.  Patient states hx of back pain, states no change from normal.  Denies abdominal pain or fever.

## 2021-02-15 NOTE — Discharge Instructions (Signed)
The urinalysis that we performed today was abnormal.  Urine culture will be performed per our protocol.  The results of urine culture should be available in the next 3 to 5 days and will be posted to your MyChart account.  If there are any abnormal results, you will be contacted by phone and if further treatment is needed, this will be provided for you.  You were advised to begin antibiotics today because you are having active symptoms of a urinary tract infection and there were nitrites present in your urinalysis today.  It is important that you complete the course of antibiotics as prescribed despite the results of your urine culture.  I have sent a prescription for Bactrim to your pharmacy, please take 1 tablet twice daily for the next 5 days.  The most common reason for a negative urine culture, despite having active symptoms of urinary tract infection, is that a truly reliable urine sample can only be obtained with the first urination of the day.  That early morning urine sample is very concentrated so it is most likely to have a significant amount of bacteria, enough to provide a positive urine culture.  Throughout the day, as you drink fluids and consume foods, your urine becomes more diluted.  Diluted urine decreases the likelihood of a negative urine culture.  Therefore, if you begin to have significant relief of your symptoms when you begin the antibiotics but receive a phone call stating that your urine culture was negative, please trust the improvement of your symptoms as a sign that the antibiotics have been effective and that you should complete the entire course.

## 2021-02-17 LAB — URINE CULTURE: Culture: NO GROWTH

## 2021-02-18 ENCOUNTER — Other Ambulatory Visit: Payer: Self-pay

## 2021-02-18 DIAGNOSIS — M545 Low back pain, unspecified: Secondary | ICD-10-CM

## 2021-02-18 NOTE — Telephone Encounter (Signed)
Continue nsaids as needed and lets go ahead and get mri lumbar spine

## 2021-02-19 ENCOUNTER — Ambulatory Visit: Payer: BC Managed Care – PPO | Admitting: Family

## 2021-02-19 ENCOUNTER — Encounter: Payer: Self-pay | Admitting: Family

## 2021-03-05 ENCOUNTER — Telehealth: Payer: Self-pay | Admitting: Orthopaedic Surgery

## 2021-03-05 NOTE — Telephone Encounter (Signed)
Left message for pt to return call to get sch for post MRI after 03/12/21 with XU

## 2021-03-12 ENCOUNTER — Ambulatory Visit
Admission: RE | Admit: 2021-03-12 | Discharge: 2021-03-12 | Disposition: A | Discharge status: Home / Self Care | Payer: BC Managed Care – PPO | Source: Ambulatory Visit | Attending: Orthopaedic Surgery | Admitting: Orthopaedic Surgery

## 2021-03-12 ENCOUNTER — Other Ambulatory Visit: Payer: Self-pay

## 2021-03-12 DIAGNOSIS — M48061 Spinal stenosis, lumbar region without neurogenic claudication: Secondary | ICD-10-CM | POA: Diagnosis not present

## 2021-03-12 DIAGNOSIS — M5136 Other intervertebral disc degeneration, lumbar region: Secondary | ICD-10-CM | POA: Diagnosis not present

## 2021-03-12 DIAGNOSIS — M545 Low back pain, unspecified: Secondary | ICD-10-CM

## 2021-03-15 ENCOUNTER — Other Ambulatory Visit: Payer: BC Managed Care – PPO

## 2021-03-17 DIAGNOSIS — U099 Post covid-19 condition, unspecified: Secondary | ICD-10-CM | POA: Diagnosis not present

## 2021-03-18 ENCOUNTER — Telehealth (INDEPENDENT_AMBULATORY_CARE_PROVIDER_SITE_OTHER): Payer: BC Managed Care – PPO | Admitting: Family Medicine

## 2021-03-18 ENCOUNTER — Encounter: Payer: Self-pay | Admitting: Family Medicine

## 2021-03-18 ENCOUNTER — Encounter: Payer: Self-pay | Admitting: Family

## 2021-03-18 DIAGNOSIS — U071 COVID-19: Secondary | ICD-10-CM

## 2021-03-18 MED ORDER — ALBUTEROL SULFATE HFA 108 (90 BASE) MCG/ACT IN AERS: 2.0000 | INHALATION_SPRAY | Freq: Four times a day (QID) | RESPIRATORY_TRACT | 0 refills | Status: DC | PRN

## 2021-03-18 MED ORDER — HYDROCODONE BIT-HOMATROP MBR 5-1.5 MG/5ML PO SOLN: 5.0000 mL | Freq: Three times a day (TID) | ORAL | 0 refills | Status: DC | PRN

## 2021-03-18 NOTE — Telephone Encounter (Signed)
Spoke with Dr.Wendling he agree'd to see patient at same time as husband today at 16

## 2021-03-18 NOTE — Progress Notes (Signed)
Chief Complaint  Patient presents with   Covid Positive    Jenny Bartlett here for URI complaints. .Due to COVID-19 pandemic, we are interacting via web portal for an electronic face-to-face visit. I verified patient's ID using 2 identifiers. Patient agreed to proceed with visit via this method. Patient is at home, I am at office. Patient and I are present for visit.   Duration: 2 days  Associated symptoms: subjective fever, sinus congestion, rhinorrhea, loss of smell, wheezing, myalgia, and coughing Denies: sinus pain, itchy watery eyes, ear pain, ear drainage, sore throat, shortness of breath, and N/V/D,  Treatment to date: Vit C, Ibuprofen, Mucinex Sick contacts: Yes; coworker Tested + for covid on 03/17/21 Had baseline covid vaccine and all boosters.   No known health problems  Objective No conversational dyspnea Age appropriate judgment and insight Nml affect and mood  COVID-19 - Plan: albuterol (VENTOLIN HFA) 108 (90 Base) MCG/ACT inhaler, HYDROcodone bit-homatropine (HYCODAN) 5-1.5 MG/5ML syrup  She's improving and doing pretty well at this time, will tx symptomatically for now. Warnings about syrup verbalized. Discussed CDC quarantining recs.  Continue to push fluids, practice good hand hygiene, cover mouth when coughing. F/u prn. If starting to experience irreplaceable fluid loss, shaking, or shortness of breath, seek immediate care. Pt voiced understanding and agreement to the plan.  [place], DO 03/18/21 10:01 AM

## 2021-03-19 ENCOUNTER — Other Ambulatory Visit: Payer: BC Managed Care – PPO

## 2021-04-09 ENCOUNTER — Other Ambulatory Visit: Payer: Self-pay | Admitting: Family Medicine

## 2021-04-09 DIAGNOSIS — U071 COVID-19: Secondary | ICD-10-CM

## 2021-04-29 ENCOUNTER — Institutional Professional Consult (permissible substitution): Payer: BC Managed Care – PPO | Admitting: Neurology

## 2021-04-30 ENCOUNTER — Encounter: Payer: Self-pay | Admitting: Neurology

## 2021-05-31 ENCOUNTER — Encounter: Payer: Self-pay | Admitting: Orthopaedic Surgery

## 2021-05-31 ENCOUNTER — Ambulatory Visit: Payer: BC Managed Care – PPO | Admitting: Orthopaedic Surgery

## 2021-05-31 DIAGNOSIS — M545 Low back pain, unspecified: Secondary | ICD-10-CM

## 2021-05-31 MED ORDER — METHOCARBAMOL 500 MG PO TABS: 500.0000 mg | ORAL_TABLET | Freq: Two times a day (BID) | ORAL | 0 refills | Status: DC | PRN

## 2021-05-31 NOTE — Progress Notes (Signed)
? ?  Office Visit Note ?  ?Patient: Jenny Bartlett           ?Date of Birth: Jun 15, 1960           ?MRN: [id number] ?Visit Date: 05/31/2021 ?             ?Requested by: Olive Bass, FNP ?[address] ?[address] ?[place],  [place] [address] ?PCP: Olive Bass, FNP ? ? ?Assessment & Plan: ?Visit Diagnoses:  ?1. Low back pain, unspecified back pain laterality, unspecified chronicity, unspecified whether sciatica present   ? ? ?Plan: Impression is chronic bilateral low back pain with right lower extremity radiculopathy consistent with recent MRI findings showing mild spinal canal stenosis mild to moderate neural foraminal narrowing L2-3, L3-4, L4-5 and L5-S1.  At this point, would like to make referral to Dr. Alvester Morin for [facility].  She will follow-up with [place] as needed. ? ?Follow-Up Instructions: Return if symptoms worsen or fail to improve.  ? ?Orders:  ?No orders of the defined types were placed in this encounter. ? ?Meds ordered this encounter  ?Medications  ? methocarbamol (ROBAXIN) 500 MG tablet  ?  Sig: Take 1 tablet (500 mg total) by mouth 2 (two) times daily as needed.  ?  Dispense:  30 tablet  ?  Refill:  0  ? ? ? ? Procedures: ?No procedures performed ? ? ?Clinical Data: ?No additional findings. ? ? ?Subjective: ?Chief Complaint  ?Patient presents with  ? Lower Back - Pain  ? ? ?HPI patient is a pleasant 61 year old female who comes in today with chronic bilateral lower back pain and right lower extremity radiculopathy.  She was seen by [place] a few months ago for this.  She had taken steroids, muscle relaxers and NSAIDs all without significant relief and therefore MRI of the lumbar spine was ordered.  MRI showed mild spinal canal stenosis mild to moderate neural foraminal narrowing L2-3, L3-4, L4-5 and L5-S1.  She has a remote history of epidural steroid injection which did seem to help. ? ? ? ? ?Objective: ?Vital Signs: There were no vitals taken for this visit. ? ? ? ?Ortho Exam unchanged lumbar  spine exam ? ?Specialty Comments:  ?No specialty comments available. ? ?Imaging: ?No new imaging ? ? ?PMFS History: ?There are no problems to display for this patient. ? ?History reviewed. No pertinent past medical history.  ?Family History  ?Problem Relation Age of Onset  ? Cancer Mother   ? Stomach cancer Mother   ? Hypertension Father   ? Colon cancer Father   ?  ?Past Surgical History:  ?Procedure Laterality Date  ? ABDOMINAL HYSTERECTOMY    ? APPENDECTOMY    ? TONSILLECTOMY    ? ?Social History  ? ?Occupational History  ? Not on file  ?Tobacco Use  ? Smoking status: Every Day  ? Smokeless tobacco: Never  ?Substance and Sexual Activity  ? Alcohol use: Not Currently  ? Drug use: Never  ? Sexual activity: Not on file  ? ? ? ? ? ? ?

## 2021-06-03 ENCOUNTER — Other Ambulatory Visit: Payer: Self-pay

## 2021-06-03 DIAGNOSIS — M545 Low back pain, unspecified: Secondary | ICD-10-CM

## 2021-06-04 ENCOUNTER — Encounter: Payer: Self-pay | Admitting: Physical Medicine and Rehabilitation

## 2021-06-10 ENCOUNTER — Encounter: Payer: Self-pay | Admitting: Orthopaedic Surgery

## 2021-07-09 ENCOUNTER — Ambulatory Visit: Payer: Self-pay

## 2021-07-09 ENCOUNTER — Ambulatory Visit: Payer: BC Managed Care – PPO | Admitting: Physical Medicine and Rehabilitation

## 2021-07-09 VITALS — BP 142/82 | HR 122

## 2021-07-09 DIAGNOSIS — M5416 Radiculopathy, lumbar region: Secondary | ICD-10-CM

## 2021-07-09 MED ORDER — METHYLPREDNISOLONE ACETATE 80 MG/ML IJ SUSP
80.0000 mg | Freq: Once | INTRAMUSCULAR | Status: AC
  Administered 2021-07-09: 80 mg

## 2021-07-09 NOTE — Progress Notes (Signed)
Low back pain with bilateral radiculopathy, worse on right side. 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

## 2021-07-22 ENCOUNTER — Telehealth: Payer: Self-pay | Admitting: Orthopaedic Surgery

## 2021-07-22 NOTE — Telephone Encounter (Signed)
Patient is experiencing bladder incontinence since getting injection in back please advise on if you want her to come back in or refer her to a specialist.

## 2021-07-23 ENCOUNTER — Telehealth: Payer: Self-pay | Admitting: Physical Medicine and Rehabilitation

## 2021-07-23 NOTE — Progress Notes (Signed)
Jenny Bartlett - 61 y.o. female MRN [id number]  Date of birth: 1960-10-17  Office Visit Note: Visit Date: 07/09/2021 PCP: Olive Bass, FNP Referred by: Olive Bass,*  Subjective: Chief Complaint  Patient presents with   Lower Back - Pain   HPI:  Jenny Bartlett is a 61 y.o. female who comes in today at the request of Dr. Glee Arvin for planned Right L5-S1 Lumbar Interlaminar epidural steroid injection with fluoroscopic guidance.  The patient has failed conservative care including home exercise, medications, time and activity modification.  This injection will be diagnostic and hopefully therapeutic.  Please see requesting physician notes for further details and justification. MRI reviewed with images and spine model.  MRI reviewed in the note below.  MRI shows very mild canal narrowing with no real stenosis or nerve compression.  There is mild to moderate foraminal narrowing.  No disc herniations.  Depending on relief consider continued physical therapy and medication management.  ROS Otherwise per HPI.  Assessment & Plan: Visit Diagnoses:    ICD-10-CM   1. Lumbar radiculopathy  M54.16 XR C-ARM NO REPORT    Epidural Steroid injection    methylPREDNISolone acetate (DEPO-MEDROL) injection 80 mg      Plan: No additional findings.   Meds & Orders:  Meds ordered this encounter  Medications   methylPREDNISolone acetate (DEPO-MEDROL) injection 80 mg    Orders Placed This Encounter  Procedures   XR C-ARM NO REPORT   Epidural Steroid injection    Follow-up: Return if symptoms worsen or fail to improve.   Procedures: No procedures performed  Lumbar Epidural Steroid Injection - Interlaminar Approach with Fluoroscopic Guidance  Patient: Jenny Bartlett      Date of Birth: August 28, 1960 MRN: [id number] PCP: Olive Bass, FNP      Visit Date: 07/09/2021   Universal Protocol:     Consent Given By: the patient  Position: PRONE  Additional  Comments: Vital signs were monitored before and after the procedure. Patient was prepped and draped in the usual sterile fashion. The correct patient, procedure, and site was verified.   Injection Procedure Details:   Procedure diagnoses: Lumbar radiculopathy [M54.16]   Meds Administered:  Meds ordered this encounter  Medications   methylPREDNISolone acetate (DEPO-MEDROL) injection 80 mg     Laterality: Right  Location/Site:  L5-S1  Needle: 3.5 in., 20 ga. Tuohy  Needle Placement: Paramedian epidural  Findings:   -Comments: Excellent flow of contrast into the epidural space.  Procedure Details: Using a paramedian approach from the side mentioned above, the region overlying the inferior lamina was localized under fluoroscopic visualization and the soft tissues overlying this structure were infiltrated with 4 ml. of 1% Lidocaine without Epinephrine. The Tuohy needle was inserted into the epidural space using a paramedian approach.   The epidural space was localized using loss of resistance along with counter oblique bi-planar fluoroscopic views.  After negative aspirate for air, blood, and CSF, a 2 ml. volume of Isovue-250 was injected into the epidural space and the flow of contrast was observed. Radiographs were obtained for documentation purposes.    The injectate was administered into the level noted above.   Additional Comments:  The patient tolerated the procedure well Dressing: 2 x 2 sterile gauze and Band-Aid    Post-procedure details: Patient was observed during the procedure. Post-procedure instructions were reviewed.  Patient left the clinic in stable condition.    Clinical History: MRI LUMBAR SPINE WITHOUT CONTRAST   TECHNIQUE: Multiplanar, multisequence MR  imaging of the lumbar spine was performed. No intravenous contrast was administered.   COMPARISON:  Lumbar spine radiograph 02/05/2021   FINDINGS: Segmentation:  Standard.   Alignment:   Physiologic.   Vertebrae: No evidence of acute fracture, discitis, or aggressive osseous lesion.   Conus medullaris and cauda equina: Conus extends to the L1 level. Conus and cauda equina appear normal.   Paraspinal and other soft tissues: Small right renal cyst which appears simple. There is mild paraspinal muscle atrophy.   Disc levels:   T11-T12: No significant spinal canal or neural foraminal narrowing.   T12-L1: Bilateral facet arthropathy, right worse than left with ligamentum flavum thickening and bony spurring. No significant spinal canal stenosis. No neural foraminal narrowing.   L1-L2: Bilateral facet arthropathy, right worse than left. No significant spinal canal or neural foraminal stenosis   L2-L3: Broad-based disc bulging, ligamentum flavum hypertrophy and mild facet arthropathy results in mild spinal canal stenosis and mild to moderate right-sided neural foraminal narrowing. No left neural foraminal narrowing.   L3-L4: Broad-based disc bulging, ligamentum flavum hypertrophy and mild facet arthropathy results in mild spinal canal stenosis, mild left and moderate right neural foraminal narrowing.   L4-L5: Broad-based disc bulging, ligamentum flavum hypertrophy and bilateral facet arthropathy results in mild spinal canal stenosis, mild left and moderate right neural foraminal narrowing.   L5-S1: Broad-based disc bulging, ligamentum flavum hypertrophy and mild facet arthropathy rules in no significant spinal canal stenosis. There is mild right neural foraminal narrowing.   IMPRESSION: Multilevel degenerative changes of the lumbar spine, with up to mild spinal canal stenosis and mild-to-moderate neural foraminal narrowing as described below:   L2-L3: Mild spinal canal stenosis. Mild-to-moderate right-sided neural foraminal stenosis.   L3-L4: Mild spinal canal stenosis. Mild left and moderate right neural foraminal stenosis.   L4-L5: Mild spinal canal  stenosis. Mild left and moderate right neural foraminal stenosis.   L5-S1: Mild right neural foraminal stenosis. No spinal canal stenosis.     Electronically Signed   By: Caprice RenshawJacob  Kahn M.D.   On: 03/13/2021 09:27     Objective:  VS:  HT:    WT:   BMI:     BP:(!) 142/82  HR:(!) 122bpm  TEMP: ( )  RESP:  Physical Exam Vitals and nursing note reviewed.  Constitutional:      General: She is not in acute distress.    Appearance: Normal appearance. She is not ill-appearing.  HENT:     Head: Normocephalic and atraumatic.     Right Ear: External ear normal.     Left Ear: External ear normal.  Eyes:     Extraocular Movements: Extraocular movements intact.  Cardiovascular:     Rate and Rhythm: Normal rate.     Pulses: Normal pulses.  Pulmonary:     Effort: Pulmonary effort is normal. No respiratory distress.  Abdominal:     General: There is no distension.     Palpations: Abdomen is soft.  Musculoskeletal:        General: Tenderness present.     Cervical back: Neck supple.     Right lower leg: No edema.     Left lower leg: No edema.     Comments: Patient has good distal strength with no pain over the greater trochanters.  No clonus or focal weakness.  Skin:    Findings: No erythema, lesion or rash.  Neurological:     General: No focal deficit present.     Mental Status: She is alert and  oriented to person, place, and time.     Sensory: No sensory deficit.     Motor: No weakness or abnormal muscle tone.     Coordination: Coordination normal.  Psychiatric:        Mood and Affect: Mood normal.        Behavior: Behavior normal.     Imaging: No results found.

## 2021-07-23 NOTE — Telephone Encounter (Signed)
Spoke with patient via telephone this morning, states onset of urinary incontinence 1 week after right L5-S1 interlaminar epidural steroid injection performed in our office. Patient reports total of 2 episodes of urinary incontinence. Injection procedure imaging looks good, I did reassure the patient that injection was performed safely. Recent lumbar MRI imaging exhibits multilevel degenerative changes of the lumbar spine, with mild spinal canal stenosis and mild-to-moderate neural foraminal narrowing. No high grade spinal stenosis noted.   I also spoke with her about lumbar spine anatomy and reassured her that there is no spinal cord at this level and I do not feel her issues are directly related to injection. Patient reports good strength to bilateral lower extremities, no issues with walking. Patient does report history of incontinence and kidney stones in the past.   I did encourage patient to follow up with primary care provider/urologist. She is instructed to call us with any other issues. Patient states good relief of pain with injection, she can see [place] back as needed and can repeat infrequently.

## 2021-07-23 NOTE — Telephone Encounter (Signed)
Hey guys, I got this message and wasn't sure how to respond as it sounds like it could be from the injection?

## 2021-07-23 NOTE — Procedures (Signed)
Lumbar Epidural Steroid Injection - Interlaminar Approach with Fluoroscopic Guidance  Patient: Jenny Bartlett      Date of Birth: Dec 10, 1960 MRN: [id number] PCP: Marrian Salvage, FNP      Visit Date: 07/09/2021   Universal Protocol:     Consent Given By: the patient  Position: PRONE  Additional Comments: Vital signs were monitored before and after the procedure. Patient was prepped and draped in the usual sterile fashion. The correct patient, procedure, and site was verified.   Injection Procedure Details:   Procedure diagnoses: Lumbar radiculopathy [M54.16]   Meds Administered:  Meds ordered this encounter  Medications   methylPREDNISolone acetate (DEPO-MEDROL) injection 80 mg     Laterality: Right  Location/Site:  L5-S1  Needle: 3.5 in., 20 ga. Tuohy  Needle Placement: Paramedian epidural  Findings:   -Comments: Excellent flow of contrast into the epidural space.  Procedure Details: Using a paramedian approach from the side mentioned above, the region overlying the inferior lamina was localized under fluoroscopic visualization and the soft tissues overlying this structure were infiltrated with 4 ml. of 1% Lidocaine without Epinephrine. The Tuohy needle was inserted into the epidural space using a paramedian approach.   The epidural space was localized using loss of resistance along with counter oblique bi-planar fluoroscopic views.  After negative aspirate for air, blood, and CSF, a 2 ml. volume of Isovue-250 was injected into the epidural space and the flow of contrast was observed. Radiographs were obtained for documentation purposes.    The injectate was administered into the level noted above.   Additional Comments:  The patient tolerated the procedure well Dressing: 2 x 2 sterile gauze and Band-Aid    Post-procedure details: Patient was observed during the procedure. Post-procedure instructions were reviewed.  Patient left the clinic in stable  condition.

## 2021-07-23 NOTE — Telephone Encounter (Signed)
Ok, great.  Thank you guys so very much

## 2021-08-16 ENCOUNTER — Ambulatory Visit: Payer: BC Managed Care – PPO | Admitting: Family

## 2021-11-12 ENCOUNTER — Ambulatory Visit: Payer: BC Managed Care – PPO | Admitting: Family

## 2021-11-12 ENCOUNTER — Encounter: Payer: Self-pay | Admitting: Family

## 2021-11-12 VITALS — BP 136/78 | HR 92 | Temp 97.7°F | Ht 67.0 in | Wt 244.6 lb

## 2021-11-12 DIAGNOSIS — E785 Hyperlipidemia, unspecified: Secondary | ICD-10-CM

## 2021-11-12 DIAGNOSIS — G8929 Other chronic pain: Secondary | ICD-10-CM

## 2021-11-12 DIAGNOSIS — Z1231 Encounter for screening mammogram for malignant neoplasm of breast: Secondary | ICD-10-CM

## 2021-11-12 DIAGNOSIS — F339 Major depressive disorder, recurrent, unspecified: Secondary | ICD-10-CM

## 2021-11-12 DIAGNOSIS — Z720 Tobacco use: Secondary | ICD-10-CM

## 2021-11-12 DIAGNOSIS — R899 Unspecified abnormal finding in specimens from other organs, systems and tissues: Secondary | ICD-10-CM

## 2021-11-12 DIAGNOSIS — M545 Low back pain, unspecified: Secondary | ICD-10-CM

## 2021-11-12 LAB — COMPREHENSIVE METABOLIC PANEL
ALT: 28 U/L (ref 0–35)
AST: 22 U/L (ref 0–37)
Albumin: 4.3 g/dL (ref 3.5–5.2)
Alkaline Phosphatase: 74 U/L (ref 39–117)
BUN: 13 mg/dL (ref 6–23)
CO2: 31 mEq/L (ref 19–32)
Calcium: 9.8 mg/dL (ref 8.4–10.5)
Chloride: 101 mEq/L (ref 96–112)
Creatinine, Ser: 0.69 mg/dL (ref 0.40–1.20)
GFR: 93.83 mL/min (ref >60.00)
Glucose, Bld: 86 mg/dL (ref 70–99)
Potassium: 4.7 mEq/L (ref 3.5–5.1)
Sodium: 139 mEq/L (ref 135–145)
Total Bilirubin: 0.5 mg/dL (ref 0.2–1.2)
Total Protein: 7 g/dL (ref 6.0–8.3)

## 2021-11-12 LAB — CBC WITH DIFFERENTIAL/PLATELET
Basophils Absolute: 0 10*3/uL (ref 0.0–0.1)
Basophils Relative: 0.7 % (ref 0.0–3.0)
Eosinophils Absolute: 0.1 10*3/uL (ref 0.0–0.7)
Eosinophils Relative: 1.8 % (ref 0.0–5.0)
HCT: 44 % (ref 36.0–46.0)
Hemoglobin: 14.9 g/dL (ref 12.0–15.0)
Lymphocytes Relative: 32 % (ref 12.0–46.0)
Lymphs Abs: 1.9 10*3/uL (ref 0.7–4.0)
MCHC: 33.8 g/dL (ref 30.0–36.0)
MCV: 94.4 fl (ref 78.0–100.0)
Monocytes Absolute: 0.4 10*3/uL (ref 0.1–1.0)
Monocytes Relative: 7.2 % (ref 3.0–12.0)
Neutro Abs: 3.5 10*3/uL (ref 1.4–7.7)
Neutrophils Relative %: 58.3 % (ref 43.0–77.0)
Platelets: 303 10*3/uL (ref 150.0–400.0)
RBC: 4.67 Mil/uL (ref 3.87–5.11)
RDW: 13.6 % (ref 11.5–15.5)
WBC: 6 10*3/uL (ref 4.0–10.5)

## 2021-11-12 LAB — LDL CHOLESTEROL, DIRECT: Direct LDL: 165 mg/dL

## 2021-11-12 LAB — LIPID PANEL
Cholesterol: 222 mg/dL — ABNORMAL HIGH (ref 0–200)
HDL: 41.8 mg/dL (ref >39.00)
NonHDL: 179.83
Total CHOL/HDL Ratio: 5
Triglycerides: 209 mg/dL — ABNORMAL HIGH (ref 0.0–149.0)
VLDL: 41.8 mg/dL — ABNORMAL HIGH (ref 0.0–40.0)

## 2021-11-12 LAB — TSH: TSH: 0.94 u[IU]/mL (ref 0.35–5.50)

## 2021-11-12 MED ORDER — ESCITALOPRAM OXALATE 10 MG PO TABS: 10.0000 mg | ORAL_TABLET | Freq: Every day | ORAL | 3 refills | Status: DC

## 2021-11-12 MED ORDER — METHOCARBAMOL 500 MG PO TABS
500.0000 mg | ORAL_TABLET | Freq: Two times a day (BID) | ORAL | 0 refills | Status: DC | PRN
Start: 2021-11-12 — End: 2022-08-15

## 2021-11-12 NOTE — Progress Notes (Signed)
Jenny Bartlett is a 61 y.o. female with the following history as recorded in EpicCare:  There are no problems to display for this patient.   Current Outpatient Medications  Medication Sig Dispense Refill   ibuprofen (ADVIL) 200 MG tablet Take 800 mg by mouth 2 (two) times daily.     escitalopram (LEXAPRO) 10 MG tablet Take 1 tablet (10 mg total) by mouth daily. 90 tablet 3   methocarbamol (ROBAXIN) 500 MG tablet Take 1 tablet (500 mg total) by mouth 2 (two) times daily as needed. 30 tablet 0   No current facility-administered medications for this visit.    Allergies: Oxycodone-acetaminophen  No past medical history on file.  Past Surgical History:  Procedure Laterality Date   ABDOMINAL HYSTERECTOMY     APPENDECTOMY     TONSILLECTOMY      Family History  Problem Relation Age of Onset   Cancer Mother    Stomach cancer Mother    Hypertension Father    Colon cancer Father     Social History   Tobacco Use   Smoking status: Every Day   Smokeless tobacco: Never  Substance Use Topics   Alcohol use: Not Currently    Subjective:   Follow up on chronic care needs- wants to get "caught back up" on the tests recommended last December at time of her CPE.    Objective:  Vitals:   11/12/21 0952  BP: 136/78  Pulse: 92  Temp: 97.7 F (36.5 C)  TempSrc: Oral  SpO2: 96%  Weight: 244 lb 9.6 oz (110.9 kg)  Height: _0  (1.702 m)    General: Well developed, well nourished, in no acute distress  Skin : Warm and dry.  Head: Normocephalic and atraumatic  Lungs: Respirations unlabored; clear to auscultation bilaterally without wheeze, rales, rhonchi  CVS exam: normal rate and regular rhythm.  Neurologic: Alert and oriented; speech intact; face symmetrical; moves all extremities well; CNII-XII intact without focal deficit   Assessment:  1. Elevated lipids   2. Abnormal laboratory test result   3. Tobacco abuse   4. Visit for screening mammogram   5. Depression, recurrent ([place])    6. Chronic low back pain, unspecified back pain laterality, unspecified whether sciatica present     Plan:  Update labs today; Check TSH, CBC today; Order for lung cancer screen; Order updated; Stable; refill updated; Stable; refill updated;  Patient defers scheduling for sleep study at this time; defers flu shot;   No follow-ups on file.  Orders Placed This Encounter  Procedures   MM Digital Screening    Standing Status:   Future    Standing Expiration Date:   11/13/2022    Order Specific Question:   Reason for Exam (SYMPTOM  OR DIAGNOSIS REQUIRED)    Answer:   screening mammogram    Order Specific Question:   Preferred imaging location?    Answer:   [facility]   Lipid panel   Comp Met (CMET)   TSH   CBC with Differential/Platelet   Ambulatory Referral Lung Cancer Screening [facility]    Referral Priority:   Routine    Referral Type:   Consultation    Referral Reason:   Specialty Services Required    Number of Visits Requested:   1    Requested Prescriptions   Signed Prescriptions Disp Refills   escitalopram (LEXAPRO) 10 MG tablet 90 tablet 3    Sig: Take 1 tablet (10 mg total) by mouth daily.  methocarbamol (ROBAXIN) 500 MG tablet 30 tablet 0    Sig: Take 1 tablet (500 mg total) by mouth 2 (two) times daily as needed.

## 2021-11-13 ENCOUNTER — Other Ambulatory Visit: Payer: Self-pay | Admitting: Family

## 2021-11-13 DIAGNOSIS — E785 Hyperlipidemia, unspecified: Secondary | ICD-10-CM

## 2021-11-13 MED ORDER — ROSUVASTATIN CALCIUM 10 MG PO TABS: 10.0000 mg | ORAL_TABLET | Freq: Every day | ORAL | 3 refills | Status: DC

## 2021-11-14 ENCOUNTER — Telehealth (HOSPITAL_BASED_OUTPATIENT_CLINIC_OR_DEPARTMENT_OTHER): Payer: Self-pay

## 2021-12-13 ENCOUNTER — Telehealth (HOSPITAL_BASED_OUTPATIENT_CLINIC_OR_DEPARTMENT_OTHER): Payer: Self-pay

## 2022-02-05 ENCOUNTER — Ambulatory Visit (HOSPITAL_BASED_OUTPATIENT_CLINIC_OR_DEPARTMENT_OTHER)
Admission: RE | Admit: 2022-02-05 | Discharge: 2022-02-05 | Disposition: A | Discharge status: Home / Self Care | Payer: BC Managed Care – PPO | Source: Ambulatory Visit | Attending: Family | Admitting: Family

## 2022-02-05 ENCOUNTER — Encounter (HOSPITAL_BASED_OUTPATIENT_CLINIC_OR_DEPARTMENT_OTHER): Payer: Self-pay

## 2022-02-05 DIAGNOSIS — Z1231 Encounter for screening mammogram for malignant neoplasm of breast: Secondary | ICD-10-CM | POA: Insufficient documentation

## 2022-02-14 ENCOUNTER — Other Ambulatory Visit: Payer: Self-pay | Admitting: Family

## 2022-02-14 ENCOUNTER — Encounter: Payer: Self-pay | Admitting: Family

## 2022-02-14 DIAGNOSIS — Z87898 Personal history of other specified conditions: Secondary | ICD-10-CM

## 2022-02-17 ENCOUNTER — Emergency Department (HOSPITAL_COMMUNITY): Payer: BC Managed Care – PPO

## 2022-02-17 ENCOUNTER — Telehealth: Payer: Self-pay | Admitting: Family

## 2022-02-17 ENCOUNTER — Other Ambulatory Visit: Payer: Self-pay

## 2022-02-17 ENCOUNTER — Encounter (HOSPITAL_COMMUNITY): Payer: Self-pay

## 2022-02-17 ENCOUNTER — Emergency Department (HOSPITAL_COMMUNITY)
Admission: EM | Admit: 2022-02-17 | Discharge: 2022-02-17 | Disposition: A | Discharge status: Home / Self Care | Payer: BC Managed Care – PPO | Attending: Emergency Medicine | Admitting: Emergency Medicine

## 2022-02-17 DIAGNOSIS — G4489 Other headache syndrome: Secondary | ICD-10-CM | POA: Diagnosis not present

## 2022-02-17 DIAGNOSIS — R519 Headache, unspecified: Secondary | ICD-10-CM | POA: Diagnosis not present

## 2022-02-17 DIAGNOSIS — Z5321 Procedure and treatment not carried out due to patient leaving prior to being seen by health care provider: Secondary | ICD-10-CM | POA: Insufficient documentation

## 2022-02-17 DIAGNOSIS — I491 Atrial premature depolarization: Secondary | ICD-10-CM | POA: Diagnosis not present

## 2022-02-17 DIAGNOSIS — R002 Palpitations: Secondary | ICD-10-CM | POA: Insufficient documentation

## 2022-02-17 DIAGNOSIS — R202 Paresthesia of skin: Secondary | ICD-10-CM | POA: Diagnosis not present

## 2022-02-17 DIAGNOSIS — R42 Dizziness and giddiness: Secondary | ICD-10-CM | POA: Diagnosis not present

## 2022-02-17 HISTORY — DX: Pure hypercholesterolemia, unspecified: E78.00

## 2022-02-17 LAB — CBC
HCT: 44.8 % (ref 36.0–46.0)
Hemoglobin: 15 g/dL (ref 12.0–15.0)
MCH: 32.2 pg (ref 26.0–34.0)
MCHC: 33.5 g/dL (ref 30.0–36.0)
MCV: 96.1 fL (ref 80.0–100.0)
Platelets: 320 10*3/uL (ref 150–400)
RBC: 4.66 MIL/uL (ref 3.87–5.11)
RDW: 13.5 % (ref 11.5–15.5)
WBC: 8.4 10*3/uL (ref 4.0–10.5)
nRBC: 0 % (ref 0.0–0.2)

## 2022-02-17 LAB — TROPONIN I (HIGH SENSITIVITY): Troponin I (High Sensitivity): 3 ng/L (ref <18)

## 2022-02-17 LAB — BASIC METABOLIC PANEL
Anion gap: 7 (ref 5–15)
BUN: 8 mg/dL (ref 8–23)
CO2: 29 mmol/L (ref 22–32)
Calcium: 9.5 mg/dL (ref 8.9–10.3)
Chloride: 105 mmol/L (ref 98–111)
Creatinine, Ser: 0.72 mg/dL (ref 0.44–1.00)
GFR, Estimated: >60 mL/min (ref >60)
Glucose, Bld: 96 mg/dL (ref 70–99)
Potassium: 4.8 mmol/L (ref 3.5–5.1)
Sodium: 141 mmol/L (ref 135–145)

## 2022-02-17 LAB — MAGNESIUM: Magnesium: 2.4 mg/dL (ref 1.7–2.4)

## 2022-02-17 NOTE — Telephone Encounter (Signed)
Nurse Assessment Nurse: Tomie China, RN, Lurena Joiner Date/Time (Eastern Time): 02/17/2022 3:46:19 PM Confirm and document reason for call. If symptomatic, describe symptoms. ---Caller states about 930 this morning she started having heart palpitations and they are making her dizzy. Her left hand is numb, but she gets this periodically from typing all the time and she has a slight headache. Her blood pressure is currently 155/85. daughter is an Charity fundraiser and was feeling her pulse and it is irregular. Does the patient have any new or worsening symptoms? ---Yes Will a triage be completed? ---Yes Related visit to physician within the last 2 weeks? ---No Does the PT have any chronic conditions? (i.e. diabetes, asthma, this includes High risk factors for pregnancy, etc.) ---Yes List chronic conditions. ---high cholesterol Is this a behavioral health or substance abuse call? ---No Guidelines Guideline Title Affirmed Question Affirmed Notes Nurse Date/Time (Eastern Time) Heart Rate and Heartbeat Questions [1] Dizziness, lightheadedness, or weakness AND [2] Tomie China, RN, Lurena Joiner 02/17/2022 3:47:48 PM PLEASE NOTE: All timestamps contained within this report are represented as [place] Standard Time. CONFIDENTIALTY NOTICE: This fax transmission is intended only for the addressee. It contains information that is legally privileged, confidential or otherwise protected from use or disclosure. If you are not the intended recipient, you are strictly prohibited from reviewing, disclosing, copying using or disseminating any of this information or taking any action in reliance on or regarding this information. If you have received this fax in error, please notify us immediately by telephone so that we can arrange for its return to [place]. Phone: [phone], Toll-Free: [phone], Fax: [phone] Page: 2 of 2 Call Id: [id number] Guidelines Guideline Title Affirmed Question Affirmed Notes Nurse Date/Time  Lamount Cohen Time) heart beating very slowly (e.g., < 50 / minute) Disp. Time Lamount Cohen Time) Disposition Final User 02/17/2022 3:13:28 PM Attempt made - message left Tomie China, RN, Lurena Joiner 02/17/2022 3:27:05 PM Attempt made - message left Tomie China, RNLurena Joiner 02/17/2022 3:53:28 PM Call EMS 911 Now Yes Torrens, RN, Lurena Joiner 02/17/2022 4:00:51 PM 911 Outcome Documentation Torrens, RN, Lurena Joiner Reason: pt is having lightheadedness and her HR is jumping from 47 to 101 with positional change. pt and family was told to call 911. upon returning a follow up call daughter informs me that EMS is en route Final Disposition 02/17/2022 3:53:28 PM Call EMS 911 Now Yes Torrens, RN, Cecil Cobbs Disagree/Comply Comply Caller Understands Yes PreDisposition InappropriateToAsk Care Advice Given Per Guideline CALL EMS 911 NOW: * Immediate medical attention is needed. You need to hang up and call 911 (or an ambulance). * Triager Discretion: I'll call you back in a few minutes to be sure you were able to reach them. CARE ADVICE given per Heart Rate and Heartbeat Questions (Adult) guideline

## 2022-02-17 NOTE — ED Notes (Signed)
Eloped

## 2022-02-17 NOTE — ED Notes (Signed)
eloped

## 2022-02-17 NOTE — ED Triage Notes (Signed)
Pt denies palpitations at this time. Pt states only has numbness in left pinkie at this time. Pt currently has dull headache.

## 2022-02-17 NOTE — Telephone Encounter (Signed)
Pt called stating she was experiencing the following symptoms:  -Heart Palpitations  -Dizziness  -BP of 155/86  Pt was transferred to triage nurse for further eval.

## 2022-02-17 NOTE — ED Provider Triage Note (Signed)
Emergency Medicine Provider Triage Evaluation Note  Jenny Bartlett , a 61 y.o. female  was evaluated in triage.  Pt complains of palpitations.  States that she apically gets short-lived palpitations approximate once per month.  Today she had the symptoms and they were persistent.  She called her daughter who is a Engineer, civil (consulting) to check on her.  EMS was called for transport as patient had some aching in her left arm and numbness in her left hand.  Symptoms have now resolved.  Per EMS, patient was in bigeminy for a period of time and then this resolved.  No fevers or chest pain.  She drinks caffeine in the morning.  No cough or cold medications.  No history of thyroid problems.  Review of Systems  Positive: Palpitations Negative: Chest pain, shortness of breath  Physical Exam  There were no vitals taken for this visit. Gen:   Awake, no distress   Resp:  Normal effort  MSK:   Moves extremities without difficulty  Other:  Normal rate, regular rhythm  Medical Decision Making  Medically screening exam initiated at 5:23 PM.  Appropriate orders placed.  Loreta Blouch was informed that the remainder of the evaluation will be completed by another provider, this initial triage assessment does not replace that evaluation, and the importance of remaining in the ED until their evaluation is complete.     Renne Crigler, PA-C 02/17/22 1734

## 2022-02-17 NOTE — ED Triage Notes (Signed)
Pt to ED via [facility]. Pt started having palpitations this morning at work. Pt has had previously but they usually go away.  Pt has had palpitations constantly today.  Pt has had numbness in left arm and left side of face since 1030 this am, has had HA since 1100 am.  Per EMS, pt has had NSR w/bigeminy PVCs. Pt has had improvement in PVCs and headache.  20g RAC.  EMS VS: 131/69 HR=74 RR=16 02=98% RA Fsbs=127

## 2022-02-18 ENCOUNTER — Other Ambulatory Visit: Payer: Self-pay | Admitting: Family

## 2022-02-18 DIAGNOSIS — R002 Palpitations: Secondary | ICD-10-CM

## 2022-02-18 NOTE — Telephone Encounter (Signed)
Pt went to ED yesterday

## 2022-03-18 ENCOUNTER — Ambulatory Visit: Payer: BC Managed Care – PPO | Attending: Cardiology | Admitting: Cardiology

## 2022-03-18 ENCOUNTER — Ambulatory Visit: Payer: BC Managed Care – PPO | Attending: Cardiology

## 2022-03-18 ENCOUNTER — Encounter: Payer: Self-pay | Admitting: Cardiology

## 2022-03-18 VITALS — BP 118/70 | HR 105 | Ht 67.0 in | Wt 247.0 lb

## 2022-03-18 DIAGNOSIS — R0789 Other chest pain: Secondary | ICD-10-CM

## 2022-03-18 DIAGNOSIS — R002 Palpitations: Secondary | ICD-10-CM | POA: Diagnosis not present

## 2022-03-18 DIAGNOSIS — I498 Other specified cardiac arrhythmias: Secondary | ICD-10-CM | POA: Diagnosis not present

## 2022-03-18 DIAGNOSIS — F17210 Nicotine dependence, cigarettes, uncomplicated: Secondary | ICD-10-CM | POA: Diagnosis not present

## 2022-03-18 DIAGNOSIS — E785 Hyperlipidemia, unspecified: Secondary | ICD-10-CM

## 2022-03-18 DIAGNOSIS — R0683 Snoring: Secondary | ICD-10-CM

## 2022-03-18 DIAGNOSIS — R5382 Chronic fatigue, unspecified: Secondary | ICD-10-CM

## 2022-03-18 DIAGNOSIS — R0609 Other forms of dyspnea: Secondary | ICD-10-CM

## 2022-03-18 MED ORDER — METOPROLOL TARTRATE 100 MG PO TABS: ORAL_TABLET | ORAL | 0 refills | Status: DC

## 2022-03-18 NOTE — Progress Notes (Addendum)
Cardiology Consultation:    Date:  03/18/2022   ID:  Aaron Edelman, DOB [birth year], MRN [id number]  PCP:  Olive Bass, FNP  Cardiologist:  Gypsy Balsam, MD   Referring MD: Olive Bass,*   Chief Complaint  Patient presents with   Tachycardia    History of Present Illness:    Jenny Bartlett is a 62 y.o. female who is being seen today for the evaluation of palpitations at the request of Olive Bass,*.  Past medical history significant for anemia, smoking which is still ongoing she was referred to [place] because on 18 December she was sitting at the computer and started feeling some heart skipping and spitting up she felt somewhat dizzy with this but not to the point of passing out she also started having tingling in the left arm and some pain in the left arm.  She eventually end up calling her daughter who is a Charity fundraiser.  She and the coming to the emergency room but by the time everything subsided.  We did see however documentation done by EMS when her EKG showed rhythm strip show ventricle bigeminy and ventricular trigeminy.  She never had anything like this before.  She says she said since that time she is still having some palpitations not as pronounced as previously but still present. She smokes and continues smoking, she does not exercise on the regular basis.  She snores a lot her husband does have obstructive sleep apnea she suspect she probably have it as well but she told me right away that she will not be able to wear her CPAP mask.  She is not on any special diet.  Past Medical History:  Diagnosis Date   Hypercholesteremia     Past Surgical History:  Procedure Laterality Date   ABDOMINAL HYSTERECTOMY     APPENDECTOMY     TONSILLECTOMY      Current Medications: Current Meds  Medication Sig   escitalopram (LEXAPRO) 10 MG tablet Take 1 tablet (10 mg total) by mouth daily.   ibuprofen (ADVIL) 200 MG tablet Take 800 mg by mouth 2 (two) times  daily.   methocarbamol (ROBAXIN) 500 MG tablet Take 1 tablet (500 mg total) by mouth 2 (two) times daily as needed. (Patient taking differently: Take 500 mg by mouth 2 (two) times daily as needed for muscle spasms.)   rosuvastatin (CRESTOR) 10 MG tablet Take 1 tablet (10 mg total) by mouth daily.     Allergies:   Patient has no known allergies.   Social History   Socioeconomic History   Marital status: Married    Spouse name: Not on file   Number of children: Not on file   Years of education: Not on file   Highest education level: Not on file  Occupational History   Not on file  Tobacco Use   Smoking status: Every Day    Types: Cigarettes   Smokeless tobacco: Never  Vaping Use   Vaping Use: Never used  Substance and Sexual Activity   Alcohol use: Not Currently   Drug use: Never   Sexual activity: Not on file  Other Topics Concern   Not on file  Social History Narrative   Not on file   Social Determinants of Health   Financial Resource Strain: Not on file  Food Insecurity: Not on file  Transportation Needs: Not on file  Physical Activity: Not on file  Stress: Not on file  Social Connections: Not on file  Family History: The patient's family history includes Cancer in her mother; Colon cancer in her father; Hypertension in her father; Stomach cancer in her mother. ROS:   Please see the history of present illness.    All 14 point review of systems negative except as described per history of present illness.  EKGs/Labs/Other Studies Reviewed:    The following studies were reviewed today:   EKG:  EKG is  ordered today.  The ekg ordered today demonstrates sinus tachycardia normal P interval, nonspecific ST segment changes  Recent Labs: 11/12/2021: ALT 28; TSH 0.94 02/17/2022: BUN 8; Creatinine, Ser 0.72; Hemoglobin 15.0; Magnesium 2.4; Platelets 320; Potassium 4.8; Sodium 141  Recent Lipid Panel    Component Value Date/Time   CHOL 222 (H) 11/12/2021 1025    TRIG 209.0 (H) 11/12/2021 1025   HDL 41.80 11/12/2021 1025   CHOLHDL 5 11/12/2021 1025   VLDL 41.8 (H) 11/12/2021 1025   LDLCALC 146 (H) 02/08/2021 0925   LDLDIRECT 165.0 11/12/2021 1025    Physical Exam:    VS:  BP 118/70 (BP Location: Left Arm, Patient Position: Sitting)   Pulse (!) 105   Ht 5\' 7"  (1.702 m)   Wt 247 lb (112 kg)   SpO2 91%   BMI 38.69 kg/m     Wt Readings from Last 3 Encounters:  03/18/22 247 lb (112 kg)  02/17/22 255 lb (115.7 kg)  11/12/21 244 lb 9.6 oz (110.9 kg)     GEN:  Well nourished, well developed in no acute distress HEENT: Normal NECK: No JVD; No carotid bruits LYMPHATICS: No lymphadenopathy CARDIAC: RRR, no murmurs, no rubs, no gallops RESPIRATORY:  Clear to auscultation without rales, wheezing or rhonchi  ABDOMEN: Soft, non-tender, non-distended MUSCULOSKELETAL:  No edema; No deformity  SKIN: Warm and dry NEUROLOGIC:  Alert and oriented x 3 PSYCHIATRIC:  Normal affect   ASSESSMENT:    1. Palpitations   2. Snoring   3. Atypical chest pain   4. Ventricular bigeminy   5. Dyslipidemia    PLAN:    In order of problems listed above:  Palpitations.  I will ask her to wear Zio patch for 2 weeks to exactly see the burden of her extrasystole.  As a part of ablation echocardiogram to be done to assess left ventricle ejection fraction. Snoring I will schedule her to have a home sleep study done. Atypical chest pain that she had when she was having palpitations.  She does have multiple risk factors for coronary artery disease namely dyslipidemia as well as smoking.  I will schedule her to have coronary CT angio to rule out obstructive disease. Ventricular bigeminy.  Will assess significance of this finding with coronary CT angio to rule out coronary artery disease as well as echocardiogram to rule out cardiomyopathy. Dyslipidemia I do have her data from few weeks ago when her LDL was 163.  She was put on cholesterol medication Crestor 10 which I  will continue we will make arrangements for fasting lipid profile to be done.  I will also schedule her to have TSH checked for completion. Smoking: Still ongoing.  She understands she need to quit and we spent some time talking about techniques to quit.  Please 5 minutes discussion about that.  Did you add a medication? No  If yes, how many? Number of meds added: 1  If no, reason?  F up visit is still pending  Did you remove a medication? No  Did you increase the dosage of any  medication? No  Did you decrease the dosage of any medication? No  Did you refer to a specialist (i.e. lipid clinic, preventive cardiology, endocrinology)? No  Has patient seen plaque report? No      Medication Adjustments/Labs and Tests Ordered: Current medicines are reviewed at length with the patient today.  Concerns regarding medicines are outlined above.  No orders of the defined types were placed in this encounter.  No orders of the defined types were placed in this encounter.   Signed, Georgeanna Lea, MD, [facility]. 03/18/2022 3:10 PM    [facility]

## 2022-03-18 NOTE — Addendum Note (Signed)
Addended by: Jacobo Forest D on: 03/18/2022 03:47 PM   Modules accepted: Orders

## 2022-03-18 NOTE — Patient Instructions (Signed)
Medication Instructions:   Take Metoprolol 100mg  2 hours prior to CT Scan  *If you need a refill on your cardiac medications before your next appointment, please call your pharmacy*   Lab Work: Your physician recommends that you return for lab work in: 1 week prior to CT scan You need to have labs done when you are fasting.  You can come Monday through Friday 8:00 am to 12:00 pm and 1:00 to 4:00. You do not need to make an appointment as the order has already been placed. The labs you are going to have done are BMET.   Testing/Procedures:   Your cardiac CT will be scheduled at one of the below locations:   [facility] [address] [place], [place] [address] [phone]   At [facility], please arrive at the [facility] (Entrance C2) of [facility] 30 minutes prior to test start time. You can use the FREE valet parking offered at entrance C (encouraged to control the heart rate for the test)  Proceed to the [facility] Radiology Department (first floor) to check-in and test prep.  All radiology patients and guests should use entrance C2 at [facility], accessed from [facility], even though the hospital's physical address listed is [address]      Please follow these instructions carefully (unless otherwise directed):   On the Night Before the Test: Be sure to Drink plenty of water. Do not consume any caffeinated/decaffeinated beverages or chocolate 12 hours prior to your test. Do not take any antihistamines 12 hours prior to your test.  On the Day of the Test: Drink plenty of water until 1 hour prior to the test. Do not eat any food 4 hours prior to the test. You may take your regular medications prior to the test.  Take metoprolol (Lopressor) two hours prior to test. FEMALES- please wear underwire-free bra if available, avoid dresses & tight clothin       After the Test: Drink  plenty of water. After receiving IV contrast, you may experience a mild flushed feeling. This is normal. On occasion, you may experience a mild rash up to 24 hours after the test. This is not dangerous. If this occurs, you can take Benadryl 25 mg and increase your fluid intake. If you experience trouble breathing, this can be serious. If it is severe call 911 IMMEDIATELY. If it is mild, please call our office. If you take any of these medications: Glipizide/Metformin, Avandament, Glucavance, please do not take 48 hours after completing test unless otherwise instructed.  We will call to schedule your test 2-4 weeks out understanding that some insurance companies will need an authorization prior to the service being performed.   For non-scheduling related questions, please contact the cardiac imaging nurse navigator should you have any questions/concerns: Marchia Bond, Cardiac Imaging Nurse Navigator Gordy Clement, Cardiac Imaging Nurse Navigator [facility] Direct Office Dial: [phone]   For scheduling needs, including cancellations and rescheduling, please call [place], [phone].   Your physician has requested that you have an echocardiogram. Echocardiography is a painless test that uses sound waves to create images of your heart. It provides your doctor with information about the size and shape of your heart and how well your heart's chambers and valves are working. This procedure takes approximately one hour. There are no restrictions for this procedure. Please do NOT wear cologne, perfume, aftershave, or lotions (deodorant is allowed).  Please arrive 15 minutes prior to your appointment time.   WHY IS MY DOCTOR PRESCRIBING ZIO? The Zio system is proven and trusted by physicians to detect and diagnose irregular heart rhythms -- and has been prescribed to hundreds of thousands of patients.  The FDA has cleared the Zio system to monitor for many different  kinds of irregular heart rhythms. In a study, physicians were able to reach a diagnosis 90% of the time with the Zio system1.  You can wear the Zio monitor -- a small, discreet, comfortable patch -- during your normal day-to-day activity, including while you sleep, shower, and exercise, while it records every single heartbeat for analysis.  1Barrett, P., et al. Comparison of 24 Hour Holter Monitoring Versus 14 Day Novel Adhesive Patch Electrocardiographic Monitoring. American Journal of Medicine, 2014.  ZIO VS. HOLTER MONITORING The Zio monitor can be comfortably worn for up to 14 days. Holter monitors can be worn for 24 to 48 hours, limiting the time to record any irregular heart rhythms you may have. Zio is able to capture data for the 51% of patients who have their first symptom-triggered arrhythmia after 48 hours.1  LIVE WITHOUT RESTRICTIONS The Zio ambulatory cardiac monitor is a small, unobtrusive, and water-resistant patch--you might even forget you're wearing it. The Zio monitor records and stores every beat of your heart, whether you're sleeping, working out, or showering.     It[facility] Sleep study- Will call when insurance precerted.   Follow-Up: At [facility], you and your health needs are our priority.  As part of our continuing mission to provide you with exceptional heart care, we have created designated Provider Care Teams.  These Care Teams include your primary Cardiologist (physician) and Advanced Practice Providers (APPs -  Physician Assistants and Nurse Practitioners) who all work together to provide you with the care you need, when you need it.  We recommend signing up for the patient portal called "MyChart".  Sign up information is provided on this After Visit Summary.  MyChart is used to connect with patients for Virtual Visits (Telemedicine).  Patients are able to view lab/test results, encounter notes, upcoming appointments, etc.  Non-urgent messages can be sent to your  provider as well.   To learn more about what you can do with MyChart, go to [email or web address].    Your next appointment:   3 month(s)  The format for your next appointment:   In Person  Provider:   Gypsy Balsam, MD   Other Instructions Cardiac CT Angiogram A cardiac CT angiogram is a procedure to look at the heart and the area around the heart. It may be done to help find the cause of chest pains or other symptoms of heart disease. During this procedure, a substance called contrast dye is injected into the blood vessels in the area to be checked. A large X-ray machine, called a CT scanner, then takes detailed pictures of the heart and the surrounding area. The procedure is also sometimes called a coronary CT angiogram, coronary artery scanning, or CTA. A cardiac CT angiogram allows the health care provider to see how well blood is flowing to and from the heart. The health care provider will be able to see if there are any problems, such as: Blockage or narrowing of the coronary arteries in the heart. Fluid around the heart. Signs of weakness or disease in the muscles, valves, and tissues of the heart. Tell a health care provider about: Any allergies you have. This is especially important  if you have had a previous allergic reaction to contrast dye. All medicines you are taking, including vitamins, herbs, eye drops, creams, and over-the-counter medicines. Any blood disorders you have. Any surgeries you have had. Any medical conditions you have. Whether you are pregnant or may be pregnant. Any anxiety disorders, chronic pain, or other conditions you have that may increase your stress or prevent you from lying still. What are the risks? Generally, this is a safe procedure. However, problems may occur, including: Bleeding. Infection. Allergic reactions to medicines or dyes. Damage to other structures or organs. Kidney damage from the contrast dye that is used. Increased risk  of cancer from radiation exposure. This risk is low. Talk with your health care provider about: The risks and benefits of testing. How you can receive the lowest dose of radiation. What happens before the procedure? Wear comfortable clothing and remove any jewelry, glasses, dentures, and hearing aids. Follow instructions from your health care provider about eating and drinking. This may include: For 12 hours before the procedure -- avoid caffeine. This includes tea, coffee, soda, energy drinks, and diet pills. Drink plenty of water or other fluids that do not have caffeine in them. Being well hydrated can prevent complications. For 4-6 hours before the procedure -- stop eating and drinking. The contrast dye can cause nausea, but this is less likely if your stomach is empty. Ask your health care provider about changing or stopping your regular medicines. This is especially important if you are taking diabetes medicines, blood thinners, or medicines to treat problems with erections (erectile dysfunction). What happens during the procedure?  Hair on your chest may need to be removed so that small sticky patches called electrodes can be placed on your chest. These will transmit information that helps to monitor your heart during the procedure. An IV will be inserted into one of your veins. You might be given a medicine to control your heart rate during the procedure. This will help to ensure that good images are obtained. You will be asked to lie on an exam table. This table will slide in and out of the CT machine during the procedure. Contrast dye will be injected into the IV. You might feel warm, or you may get a metallic taste in your mouth. You will be given a medicine called nitroglycerin. This will relax or dilate the arteries in your heart. The table that you are lying on will move into the CT machine tunnel for the scan. The person running the machine will give you instructions while the scans  are being done. You may be asked to: Keep your arms above your head. Hold your breath. Stay very still, even if the table is moving. When the scanning is complete, you will be moved out of the machine. The IV will be removed. The procedure may vary among health care providers and hospitals. What can I expect after the procedure? After your procedure, it is common to have: A metallic taste in your mouth from the contrast dye. A feeling of warmth. A headache from the nitroglycerin. Follow these instructions at home: Take over-the-counter and prescription medicines only as told by your health care provider. If you are told, drink enough fluid to keep your urine pale yellow. This will help to flush the contrast dye out of your body. Most people can return to their normal activities right after the procedure. Ask your health care provider what activities are safe for you. It is up to you to  get the results of your procedure. Ask your health care provider, or the department that is doing the procedure, when your results will be ready. Keep all follow-up visits as told by your health care provider. This is important. Contact a health care provider if: You have any symptoms of allergy to the contrast dye. These include: Shortness of breath. Rash or hives. A racing heartbeat. Summary A cardiac CT angiogram is a procedure to look at the heart and the area around the heart. It may be done to help find the cause of chest pains or other symptoms of heart disease. During this procedure, a large X-ray machine, called a CT scanner, takes detailed pictures of the heart and the surrounding area after a contrast dye has been injected into blood vessels in the area. Ask your health care provider about changing or stopping your regular medicines before the procedure. This is especially important if you are taking diabetes medicines, blood thinners, or medicines to treat erectile dysfunction. If you are told,  drink enough fluid to keep your urine pale yellow. This will help to flush the contrast dye out of your body. This information is not intended to replace advice given to you by your health care provider. Make sure you discuss any questions you have with your health care provider. Document Revised: 10/13/2018 Document Reviewed: 10/13/2018 Elsevier Patient Education  2020 [employer].

## 2022-03-19 ENCOUNTER — Telehealth: Payer: Self-pay | Admitting: *Deleted

## 2022-03-19 NOTE — Telephone Encounter (Signed)
Secure chat sent to Jacobo Forest ok to activate itamar. Crelon Auth # [id number]. Valid dates 03/19/22 to 05/17/22.

## 2022-03-23 ENCOUNTER — Encounter (INDEPENDENT_AMBULATORY_CARE_PROVIDER_SITE_OTHER): Payer: BC Managed Care – PPO | Admitting: Cardiology

## 2022-03-23 DIAGNOSIS — G4733 Obstructive sleep apnea (adult) (pediatric): Secondary | ICD-10-CM

## 2022-03-24 ENCOUNTER — Ambulatory Visit: Payer: BC Managed Care – PPO | Attending: Cardiology

## 2022-03-24 DIAGNOSIS — R0683 Snoring: Secondary | ICD-10-CM

## 2022-03-24 NOTE — Procedures (Signed)
SLEEP STUDY REPORT Patient Information Study Date: 03/23/2022 Patient Name: Jenny Bartlett Patient ID: [id number] Birth Date: 1960/12/02 Age: 62 Gender: Female BMI: 38.8 (W=247 lb, H=5' 7'') Referring Physician: Jenne Campus, MD  TEST DESCRIPTION:  Home sleep apnea testing was completed using the WatchPat, a Type 1 device, utilizing peripheral arterial tonometry (PAT), chest movement, actigraphy, pulse oximetry, pulse rate, body position and snore.  AHI was calculated with apnea and hypopnea using valid sleep time as the denominator. RDI includes apneas, hypopneas, and RERAs.  The data acquired and the scoring of sleep and all associated events were performed in accordance with the recommended standards and specifications as outlined in the AASM Manual for the Scoring of Sleep and Associated Events 2.2.0 (2015).  FINDINGS:  1.  Moderate Obstructive Sleep Apnea with AHI 23.1/hr.   2.  No Central Sleep Apnea with pAHIc 2.2/hr.  3.  Oxygen desaturations as low as 84%.  4.  Mild snoring was present. O2 sats were < 88% for 6.9 min.  5.  Total sleep time was 5 hrs and 57 min.  6.  18.1% of total sleep time was spent in REM sleep.   7.  Normal sleep onset latency at 22 min  8.  Prolonged REM sleep onset latency at163  min.   9.  Total awakenings were 15.  10. Arrhythmia detection: None.  DIAGNOSIS:   Moderate Obstructive Sleep Apnea (G47.33) Nocturnal Hyopoxmia  RECOMMENDATIONS:   1.  Clinical correlation of these findings is necessary.  The decision to treat obstructive sleep apnea (OSA) is usually based on the presence of apnea symptoms or the presence of associated medical conditions such as Hypertension, Congestive Heart Failure, Atrial Fibrillation or Obesity.  The most common symptoms of OSA are snoring, gasping for breath while sleeping, daytime sleepiness and fatigue.   2.  Initiating apnea therapy is recommended given the presence of symptoms and/or associated  conditions. Recommend proceeding with one of the following:     a.  Auto-CPAP therapy with a pressure range of 5-20cm H2O.     b.  An oral appliance (OA) that can be obtained from certain dentists with expertise in sleep medicine.  These are primarily of use in non-obese patients with mild and moderate disease.     c.  An ENT consultation which may be useful to look for specific causes of obstruction and possible treatment options.     d.  If patient is intolerant to PAP therapy, consider referral to ENT for evaluation for hypoglossal nerve stimulator.   3.  Close follow-up is necessary to ensure success with CPAP or oral appliance therapy for maximum benefit.  4.  A follow-up oximetry study on CPAP is recommended to assess the adequacy of therapy and determine the need for supplemental oxygen or the potential need for Bi-level therapy.  An arterial blood gas to determine the adequacy of baseline ventilation and oxygenation should also be considered.  5.  Healthy sleep recommendations include:  adequate nightly sleep (normal 7-9 hrs/night), avoidance of caffeine after noon and alcohol near bedtime, and maintaining a sleep environment that is cool, dark and quiet.  6.  Weight loss for overweight patients is recommended.  Even modest amounts of weight loss can significantly improve the severity of sleep apnea.  7.  Snoring recommendations include:  weight loss where appropriate, side sleeping, and avoidance of alcohol before bed.  8.  Operation of motor vehicle should not be performed when sleepy.  Signature:  Fransico Him, MD; [facility]; [place], [place] Board of Sleep Medicine Electronically Signed: 03/24/2022

## 2022-03-25 DIAGNOSIS — R5382 Chronic fatigue, unspecified: Secondary | ICD-10-CM | POA: Diagnosis not present

## 2022-03-25 DIAGNOSIS — R0789 Other chest pain: Secondary | ICD-10-CM | POA: Diagnosis not present

## 2022-03-25 DIAGNOSIS — R002 Palpitations: Secondary | ICD-10-CM | POA: Diagnosis not present

## 2022-03-25 DIAGNOSIS — I498 Other specified cardiac arrhythmias: Secondary | ICD-10-CM | POA: Diagnosis not present

## 2022-03-25 DIAGNOSIS — E785 Hyperlipidemia, unspecified: Secondary | ICD-10-CM | POA: Diagnosis not present

## 2022-03-26 LAB — LIPID PANEL
Chol/HDL Ratio: 3.8 ratio (ref 0.0–4.4)
Cholesterol, Total: 151 mg/dL (ref 100–199)
HDL: 40 mg/dL (ref >39)
LDL Chol Calc (NIH): 85 mg/dL (ref 0–99)
Triglycerides: 147 mg/dL (ref 0–149)
VLDL Cholesterol Cal: 26 mg/dL (ref 5–40)

## 2022-03-26 LAB — AST: AST: 24 IU/L (ref 0–40)

## 2022-03-26 LAB — BASIC METABOLIC PANEL
BUN/Creatinine Ratio: 16 (ref 12–28)
BUN: 12 mg/dL (ref 8–27)
CO2: 23 mmol/L (ref 20–29)
Calcium: 10.2 mg/dL (ref 8.7–10.3)
Chloride: 99 mmol/L (ref 96–106)
Creatinine, Ser: 0.73 mg/dL (ref 0.57–1.00)
Glucose: 112 mg/dL — ABNORMAL HIGH (ref 70–99)
Potassium: 5.2 mmol/L (ref 3.5–5.2)
Sodium: 140 mmol/L (ref 134–144)
eGFR: 94 mL/min/{1.73_m2} (ref >59)

## 2022-03-26 LAB — ALT: ALT: 29 IU/L (ref 0–32)

## 2022-03-26 LAB — TSH
TSH: 1.07 u[IU]/mL (ref 0.450–4.500)
TSH: 1.11 u[IU]/mL (ref 0.450–4.500)

## 2022-03-27 ENCOUNTER — Other Ambulatory Visit: Payer: Self-pay | Admitting: Cardiology

## 2022-03-27 ENCOUNTER — Telehealth: Payer: Self-pay | Admitting: *Deleted

## 2022-03-27 ENCOUNTER — Telehealth (HOSPITAL_COMMUNITY): Payer: Self-pay | Admitting: *Deleted

## 2022-03-27 DIAGNOSIS — G4733 Obstructive sleep apnea (adult) (pediatric): Secondary | ICD-10-CM

## 2022-03-27 DIAGNOSIS — G4736 Sleep related hypoventilation in conditions classified elsewhere: Secondary | ICD-10-CM

## 2022-03-27 NOTE — Telephone Encounter (Signed)
-----  Message from Sueanne Margarita, MD sent at 03/24/2022  9:50 AM EST ----- Please let patient know that they have sleep apnea.  Recommend therapeutic CPAP titration for treatment of patient's sleep disordered breathing.  If unable to perform an in lab titration then initiate ResMed auto CPAP from 4 to 15cm H2O with heated humidity and mask of choice and overnight pulse ox on CPAP.

## 2022-03-27 NOTE — Telephone Encounter (Signed)
Patient returned a call to me and was given sleep study results and recommendations. She agrees to proceed with CPAP titration. Dillon Bjork # [id number]. Valid dates 03/27/22 to 05/25/22.

## 2022-03-27 NOTE — Telephone Encounter (Signed)
Attempted to call patient regarding upcoming cardiac CT appointment. °Left message on voicemail with name and callback number ° °Aleeza Bellville RN Navigator Cardiac Imaging °[facility] °[phone] Office °[phone] Cell ° °

## 2022-03-27 NOTE — Telephone Encounter (Signed)
Message left to return a call to discuss sleep study results and recommendations. 

## 2022-03-28 ENCOUNTER — Ambulatory Visit (HOSPITAL_COMMUNITY)
Admission: RE | Admit: 2022-03-28 | Discharge: 2022-03-28 | Disposition: A | Discharge status: Home / Self Care | Payer: BC Managed Care – PPO | Source: Ambulatory Visit | Attending: Cardiology | Admitting: Cardiology

## 2022-03-28 DIAGNOSIS — R0789 Other chest pain: Secondary | ICD-10-CM

## 2022-03-28 DIAGNOSIS — I498 Other specified cardiac arrhythmias: Secondary | ICD-10-CM | POA: Diagnosis not present

## 2022-03-28 MED ORDER — NITROGLYCERIN 0.4 MG SL SUBL
SUBLINGUAL_TABLET | SUBLINGUAL | Status: AC
  Filled 2022-03-28: qty 2

## 2022-03-28 MED ORDER — NITROGLYCERIN 0.4 MG SL SUBL
0.8000 mg | SUBLINGUAL_TABLET | Freq: Once | SUBLINGUAL | Status: AC
  Administered 2022-03-28: 0.8 mg via SUBLINGUAL

## 2022-03-28 MED ORDER — IOHEXOL 350 MG/ML SOLN
95.0000 mL | Freq: Once | INTRAVENOUS | Status: AC | PRN
  Administered 2022-03-28: 95 mL via INTRAVENOUS

## 2022-03-28 MED ORDER — METOPROLOL TARTRATE 5 MG/5ML IV SOLN: 5.0000 mg | INTRAVENOUS | Status: DC | PRN

## 2022-03-28 MED ORDER — DILTIAZEM HCL 25 MG/5ML IV SOLN: 5.0000 mg | INTRAVENOUS | Status: DC | PRN

## 2022-03-28 MED ORDER — METOPROLOL TARTRATE 5 MG/5ML IV SOLN
INTRAVENOUS | Status: AC
  Administered 2022-03-28: 5 mg via INTRAVENOUS
  Filled 2022-03-28: qty 10

## 2022-04-02 DIAGNOSIS — R002 Palpitations: Secondary | ICD-10-CM | POA: Diagnosis not present

## 2022-04-02 DIAGNOSIS — I498 Other specified cardiac arrhythmias: Secondary | ICD-10-CM | POA: Diagnosis not present

## 2022-04-04 ENCOUNTER — Ambulatory Visit (HOSPITAL_BASED_OUTPATIENT_CLINIC_OR_DEPARTMENT_OTHER): Payer: BC Managed Care – PPO

## 2022-04-07 ENCOUNTER — Telehealth: Payer: Self-pay

## 2022-04-07 NOTE — Telephone Encounter (Signed)
Left message on My Chart with normal results per Dr. Krasowski's note. Routed to PCP.  

## 2022-04-15 ENCOUNTER — Ambulatory Visit (HOSPITAL_BASED_OUTPATIENT_CLINIC_OR_DEPARTMENT_OTHER): Payer: BC Managed Care – PPO | Admitting: Cardiology

## 2022-06-17 ENCOUNTER — Telehealth: Payer: Self-pay | Admitting: Cardiology

## 2022-06-17 NOTE — Telephone Encounter (Signed)
Pt states she was told that her results were fine and she just needed to have sleep study so she would like to f/u virtually on Friday instead of in person. Is this okay?

## 2022-06-20 ENCOUNTER — Ambulatory Visit: Payer: BC Managed Care – PPO | Attending: Cardiology | Admitting: Cardiology

## 2022-07-07 ENCOUNTER — Other Ambulatory Visit: Payer: Self-pay | Admitting: Family

## 2022-07-29 NOTE — Telephone Encounter (Signed)
Patient is requesting call back to reschedule missed mychart appt.

## 2022-08-15 ENCOUNTER — Ambulatory Visit: Payer: BC Managed Care – PPO | Attending: Cardiology | Admitting: Cardiology

## 2022-08-15 ENCOUNTER — Encounter: Payer: Self-pay | Admitting: Cardiology

## 2022-08-15 VITALS — BP 129/94 | HR 94 | Ht 67.0 in | Wt 239.0 lb

## 2022-08-15 DIAGNOSIS — I2510 Atherosclerotic heart disease of native coronary artery without angina pectoris: Secondary | ICD-10-CM

## 2022-08-15 DIAGNOSIS — I498 Other specified cardiac arrhythmias: Secondary | ICD-10-CM | POA: Diagnosis not present

## 2022-08-15 DIAGNOSIS — F17210 Nicotine dependence, cigarettes, uncomplicated: Secondary | ICD-10-CM

## 2022-08-15 DIAGNOSIS — R0609 Other forms of dyspnea: Secondary | ICD-10-CM | POA: Diagnosis not present

## 2022-08-15 DIAGNOSIS — E785 Hyperlipidemia, unspecified: Secondary | ICD-10-CM | POA: Diagnosis not present

## 2022-08-15 DIAGNOSIS — R0683 Snoring: Secondary | ICD-10-CM

## 2022-08-15 DIAGNOSIS — R0789 Other chest pain: Secondary | ICD-10-CM

## 2022-08-15 NOTE — Patient Instructions (Signed)
Medication Instructions:  Your physician recommends that you continue on your current medications as directed. Please refer to the Current Medication list given to you today.  *If you need a refill on your cardiac medications before your next appointment, please call your pharmacy*   Lab Work:  [facility] [address] [address] Your physician recommends that you return for lab work in: when fasting You need to have labs done when you are fasting.  You can come Monday through Friday 8:00 am to 11:30 am and 1:00 to 4:00. You do not need to make an appointment as the order has already been placed. The labs you are going to have done are AST, ALT Lipids.    Testing/Procedures: Your physician has requested that you have an echocardiogram. Echocardiography is a painless test that uses sound waves to create images of your heart. It provides your doctor with information about the size and shape of your heart and how well your heart's chambers and valves are working. This procedure takes approximately one hour. There are no restrictions for this procedure. Please do NOT wear cologne, perfume, aftershave, or lotions (deodorant is allowed). Please arrive 15 minutes prior to your appointment time.    Follow-Up: At [facility], you and your health needs are our priority.  As part of our continuing mission to provide you with exceptional heart care, we have created designated Provider Care Teams.  These Care Teams include your primary Cardiologist (physician) and Advanced Practice Providers (APPs -  Physician Assistants and Nurse Practitioners) who all work together to provide you with the care you need, when you need it.  We recommend signing up for the patient portal called "MyChart".  Sign up information is provided on this After Visit Summary.  MyChart is used to connect with patients for Virtual Visits (Telemedicine).  Patients are able to view lab/test results, encounter notes, upcoming  appointments, etc.  Non-urgent messages can be sent to your provider as well.   To learn more about what you can do with MyChart, go to [email or web address].    Your next appointment:   5 month(s)  The format for your next appointment:   In Person  Provider:   Gypsy Balsam, MD    Other Instructions NA

## 2022-08-15 NOTE — Progress Notes (Signed)
Virtual Visit via Video Note   Because of Jenny Bartlett's co-morbid illnesses, she is at least at moderate risk for complications without adequate follow up.  This format is felt to be most appropriate for this patient at this time.  All issues noted in this document were discussed and addressed.  A limited physical exam was performed with this format.  Please refer to the patient's chart for her consent to telehealth for [facility].     Evaluation Performed:  Follow-up visit  This visit type was conducted due to national recommendations for restrictions regarding the COVID-19 Pandemic (e.g. social distancing).  This format is felt to be most appropriate for this patient at this time.  All issues noted in this document were discussed and addressed.  No physical exam was performed (except for noted visual exam findings with Video Visits).  Please refer to the patient's chart (MyChart message for video visits and phone note for telephone visits) for the patient's consent to telehealth for [facility].  Date:  08/15/2022  ID: Aaron Edelman, DOB Oct 12, 1960, MRN [id number]   Patient Location: [address] [place] [facility] [id number]   Provider location:   [facility] [place] [facility]  PCP:  Olive Bass, FNP  Cardiologist:  Gypsy Balsam, MD     Chief Complaint: Doing well  History of Present Illness:    Jenny Bartlett is a 62 y.o. female  who presents via audio/video conferencing for a telehealth visit today.  she was referred to my office initially for palpitations.  We did quite extensive evaluation that included coronary CT angio because of atypical chest pain, likely she was noted to have only minimal disease in LAD, she also snores significantly she did have a sleep study done which showed moderate sleep apnea she comes today to months for follow-up.  She still complains of having some palpitations.  She also reported to have some swollen  ankles and evening time.  Denies have any chest pain tightness squeezing pressure burning chest seems to be doing quite well.   The patient does not have symptoms concerning for COVID-19 infection (fever, chills, cough, or new SHORTNESS OF BREATH).    Prior CV studies:   The following studies were reviewed today:  Coronary CT angio showing only minimal disease in LAD, monitor has been reviewed with did not show any significant arrhythmia     Past Medical History:  Diagnosis Date   Hypercholesteremia     Past Surgical History:  Procedure Laterality Date   ABDOMINAL HYSTERECTOMY     APPENDECTOMY     TONSILLECTOMY       Current Meds  Medication Sig   escitalopram (LEXAPRO) 10 MG tablet Take 1 tablet (10 mg total) by mouth daily.   ibuprofen (ADVIL) 200 MG tablet Take 800 mg by mouth 2 (two) times daily as needed for mild pain or moderate pain.   rosuvastatin (CRESTOR) 10 MG tablet Take 1 tablet (10 mg total) by mouth daily.      Family History: The patient's family history includes Cancer in her mother; Colon cancer in her father; Hypertension in her father; Stomach cancer in her mother.   ROS:   Please see the history of present illness.     All other systems reviewed and are negative.   Labs/Other Tests and Data Reviewed:     Recent Labs: 02/17/2022: Hemoglobin 15.0; Magnesium 2.4; Platelets 320 03/25/2022: ALT 29; BUN 12; Creatinine, Ser 0.73; Potassium 5.2; Sodium  140; TSH 1.070  Recent Lipid Panel    Component Value Date/Time   CHOL 151 03/25/2022 0808   TRIG 147 03/25/2022 0808   HDL 40 03/25/2022 0808   CHOLHDL 3.8 03/25/2022 0808   CHOLHDL 5 11/12/2021 1025   VLDL 41.8 (H) 11/12/2021 1025   LDLCALC 85 03/25/2022 0808   LDLDIRECT 165.0 11/12/2021 1025      Exam:    Vital Signs:  BP (!) 129/94   Pulse 94   Ht 5\' 7"  (1.702 m)   Wt 239 lb (108.4 kg)   BMI 37.43 kg/m     Wt Readings from Last 3 Encounters:  08/15/22 239 lb (108.4 kg)   03/18/22 247 lb (112 kg)  02/17/22 255 lb (115.7 kg)     Well nourished, well developed in no acute distress. Alert awake oriented x 3 video link doing well  Diagnosis for this visit:   1. Dyspnea on exertion   2. Dyslipidemia   3. Ventricular bigeminy   4. Coronary artery disease involving native coronary artery of native heart without angina pectoris   5. Cigarette smoker   6. Snoring   7. Atypical chest pain      ASSESSMENT & PLAN:    1.  Coronary artery disease only minimal based on coronary CT angio.  Will continue antiplatelets therapy in form of aspirin. Dyslipidemia she is taking Crestor 10.  She is been taking this for a few months will recheck her fasting lipid profile. Obstructive sleep apnea to be follow-up by our sleep team she is waiting for equipment. Smoking we spent a great deal of time talking about need to quit she understands she will try to do it. Swollen ankle.  She did not have echocardiogram done will make sure that the study will be done.  So she will be scheduled to have it done  COVID-19 Education: The signs and symptoms of COVID-19 were discussed with the patient and how to seek care for testing (follow up with PCP or arrange E-visit).  The importance of social distancing was discussed today.  Patient Risk:   After full review of this patients clinical status, I feel that they are at least moderate risk at this time.  Time:   Today, I have spent 20 minutes with the patient with telehealth technology discussing pt health issues.  I spent 15 minutes reviewing her chart before the visit.  Visit was finished at 8:37 AM.    Medication Adjustments/Labs and Tests Ordered: Current medicines are reviewed at length with the patient today.  Concerns regarding medicines are outlined above.  Orders Placed This Encounter  Procedures   ALT   AST   Lipid panel   ECHOCARDIOGRAM COMPLETE   Medication changes: No orders of the defined types were placed in  this encounter.    Disposition: Follow-up 5 months  Signed, Georgeanna Lea, MD, [facility] 08/15/2022 8:35 AM    [facility]

## 2022-08-18 ENCOUNTER — Encounter: Payer: Self-pay | Admitting: Cardiology

## 2022-08-20 ENCOUNTER — Telehealth: Payer: Self-pay

## 2022-08-20 MED ORDER — METOPROLOL TARTRATE 25 MG PO TABS: 25.0000 mg | ORAL_TABLET | Freq: Two times a day (BID) | ORAL | 3 refills | Status: DC

## 2022-08-20 NOTE — Telephone Encounter (Signed)
Metoprolol Tartrate 25mg  1 BID sent to pharmacy per Dr. Bing Matter.

## 2022-09-24 ENCOUNTER — Ambulatory Visit (HOSPITAL_BASED_OUTPATIENT_CLINIC_OR_DEPARTMENT_OTHER)
Admission: RE | Admit: 2022-09-24 | Discharge: 2022-09-24 | Disposition: A | Discharge status: Home / Self Care | Payer: BC Managed Care – PPO | Source: Ambulatory Visit | Attending: Cardiology | Admitting: Cardiology

## 2022-09-24 DIAGNOSIS — R0609 Other forms of dyspnea: Secondary | ICD-10-CM | POA: Insufficient documentation

## 2022-09-24 LAB — ECHOCARDIOGRAM COMPLETE
Area-P 1/2: 4.15 cm2
S' Lateral: 4 cm

## 2022-10-06 ENCOUNTER — Emergency Department (HOSPITAL_BASED_OUTPATIENT_CLINIC_OR_DEPARTMENT_OTHER)
Admission: EM | Admit: 2022-10-06 | Discharge: 2022-10-06 | Disposition: A | Discharge status: Home / Self Care | Payer: BC Managed Care – PPO | Attending: Emergency Medicine | Admitting: Emergency Medicine

## 2022-10-06 ENCOUNTER — Encounter (HOSPITAL_BASED_OUTPATIENT_CLINIC_OR_DEPARTMENT_OTHER): Payer: Self-pay

## 2022-10-06 ENCOUNTER — Other Ambulatory Visit (HOSPITAL_BASED_OUTPATIENT_CLINIC_OR_DEPARTMENT_OTHER): Payer: Self-pay

## 2022-10-06 ENCOUNTER — Other Ambulatory Visit: Payer: Self-pay

## 2022-10-06 ENCOUNTER — Encounter: Payer: Self-pay | Admitting: Family

## 2022-10-06 ENCOUNTER — Telehealth: Payer: Self-pay | Admitting: Orthopaedic Surgery

## 2022-10-06 DIAGNOSIS — M545 Low back pain, unspecified: Secondary | ICD-10-CM | POA: Diagnosis not present

## 2022-10-06 DIAGNOSIS — Z79899 Other long term (current) drug therapy: Secondary | ICD-10-CM | POA: Insufficient documentation

## 2022-10-06 DIAGNOSIS — M5441 Lumbago with sciatica, right side: Secondary | ICD-10-CM

## 2022-10-06 LAB — URINALYSIS, ROUTINE W REFLEX MICROSCOPIC
Bilirubin Urine: NEGATIVE
Glucose, UA: NEGATIVE mg/dL
Hgb urine dipstick: NEGATIVE
Ketones, ur: NEGATIVE mg/dL
Leukocytes,Ua: NEGATIVE
Nitrite: NEGATIVE
Protein, ur: 30 mg/dL — AB
Specific Gravity, Urine: 1.02 (ref 1.005–1.030)
pH: 7.5 (ref 5.0–8.0)

## 2022-10-06 LAB — URINALYSIS, MICROSCOPIC (REFLEX)

## 2022-10-06 MED ORDER — METHOCARBAMOL 500 MG PO TABS
500.0000 mg | ORAL_TABLET | Freq: Two times a day (BID) | ORAL | 0 refills | Status: DC
  Filled 2022-10-06: qty 20, 10d supply, fill #0

## 2022-10-06 MED ORDER — DEXAMETHASONE SODIUM PHOSPHATE 10 MG/ML IJ SOLN
10.0000 mg | Freq: Once | INTRAMUSCULAR | Status: AC
  Administered 2022-10-06: 10 mg via INTRAMUSCULAR
  Filled 2022-10-06: qty 1

## 2022-10-06 NOTE — ED Triage Notes (Signed)
Patient here POV from Home.  Endorses Back Pain (Lower and radiates to Hips). Began Last PM while showering (1700) and has been consistent since. Had difficulty bending over to pick up objects today. Intermittent Numbness to Right Thigh.   History of Arthritis and Bone Spurs. Notes Need to Urinate but little output. No Dysuria. No Hematuria. No Fevers.   NAD Noted During Triage. A&Ox4. GCS 15. Ambulatory.

## 2022-10-06 NOTE — Telephone Encounter (Signed)
Patient called wanting to if you can send something for her back pain.CB#[phone]

## 2022-10-06 NOTE — ED Provider Notes (Signed)
[place] [facility] AT [facility] Provider Note   CSN: [id number] Arrival date & time: 10/06/22  1045     History  Chief Complaint  Patient presents with   Back Pain    Jenny Bartlett is a 62 y.o. female.  Patient is a 62 year old female who presents with back pain.  She says she has a history of chronic back pain with bone spurs and she has some baseline pain in her back but about twice a year it flares up.  She says it has flared up yesterday and today.  She has pain across her lower back but more on the right side.  She has some radiation down her right leg.  She has some intermittent tingling in her right thigh, particularly when she stands up or bends a certain way.  She denies any incontinence.  No change in her urination or stool.  No loss of bowel or bladder control.  No weakness in the legs.  No fevers.  No associate abdominal pain.  No vomiting.  No recent falls or trauma.       Home Medications Prior to Admission medications   Medication Sig Start Date End Date Taking? Authorizing Provider  methocarbamol (ROBAXIN) 500 MG tablet Take 1 tablet (500 mg total) by mouth 2 (two) times daily. 10/06/22  Yes Rolan Bucco, MD  escitalopram (LEXAPRO) 10 MG tablet Take 1 tablet (10 mg total) by mouth daily. 11/12/21   Olive Bass, FNP  ibuprofen (ADVIL) 200 MG tablet Take 800 mg by mouth 2 (two) times daily as needed for mild pain or moderate pain.    [provider]  metoprolol tartrate (LOPRESSOR) 25 MG tablet Take 1 tablet (25 mg total) by mouth 2 (two) times daily. 08/20/22 11/18/22  Georgeanna Lea, MD  rosuvastatin (CRESTOR) 10 MG tablet Take 1 tablet (10 mg total) by mouth daily. 11/13/21   Olive Bass, FNP      Allergies    Patient has no known allergies.    Review of Systems   Review of Systems  Constitutional:  Negative for fever.  Gastrointestinal:  Negative for nausea and vomiting.  Musculoskeletal:  Positive  for back pain. Negative for arthralgias, joint swelling and neck pain.  Skin:  Negative for wound.  Neurological:  Positive for numbness. Negative for weakness and headaches.    Physical Exam Updated Vital Signs BP (!) 141/81 (BP Location: Right Arm)   Pulse 85   Temp 98.1 F (36.7 C) (Oral)   Resp 20   Ht 5\' 7"  (1.702 m)   Wt 107.5 kg   SpO2 94%   BMI 37.12 kg/m  Physical Exam Constitutional:      Appearance: She is well-developed.  HENT:     Head: Normocephalic and atraumatic.  Cardiovascular:     Rate and Rhythm: Normal rate.  Pulmonary:     Effort: Pulmonary effort is normal.  Musculoskeletal:        General: Tenderness present.     Cervical back: Normal range of motion and neck supple.     Comments: Positive tenderness in the lower lumbosacral spine.  There is also pain in the right lumbar area.  Positive straight leg raise on the right.  Patellar reflexes symmetric.  Pedal pulses are intact.  She has normal sensation and motor function distally in the legs.  Skin:    General: Skin is warm and dry.  Neurological:     Mental Status: She is alert and  oriented to person, place, and time.     ED Results / Procedures / Treatments   Labs (all labs ordered are listed, but only abnormal results are displayed) Labs Reviewed  URINALYSIS, ROUTINE W REFLEX MICROSCOPIC - Abnormal; Notable for the following components:      Result Value   Protein, ur 30 (*)    All other components within normal limits  URINALYSIS, MICROSCOPIC (REFLEX) - Abnormal; Notable for the following components:   Bacteria, UA FEW (*)    All other components within normal limits    EKG None  Radiology No results found.  Procedures Procedures    Medications Ordered in ED Medications  dexamethasone (DECADRON) injection 10 mg (10 mg Intramuscular Given 10/06/22 1356)    ED Course/ Medical Decision Making/ A&P                                 Medical Decision Making Amount and/or Complexity  of Data Reviewed Labs: ordered.  Risk Prescription drug management.   Patient is a 63 year old who presents with a flareup of her chronic back pain.  She does not have any focal neurologic deficits.  No suggestions of cauda equina.  No fevers or symptoms of UTI.  Her urine is not consistent with a UTI.  She does not have abdominal pain or other concerns for AAA.  She was given a shot of Decadron in the ED.  She states that muscle relaxers have helped her in the past.  Will give her prescription for Robaxin which she has been on in the past.  She was discharged home in good condition.  Was encouraged to follow-up with her primary care provider if her symptoms are improving.  Return precautions were given.  Final Clinical Impression(s) / ED Diagnoses Final diagnoses:  Acute midline low back pain with right-sided sciatica    Rx / DC Orders ED Discharge Orders          Ordered    methocarbamol (ROBAXIN) 500 MG tablet  2 times daily        10/06/22 1412              Rolan Bucco, MD 10/06/22 1417

## 2022-10-06 NOTE — Telephone Encounter (Signed)
We have not seen her in almost 1 1/2 years. She will need to come in for fu.  I would recommend moore or megan though since it is for back

## 2022-10-29 ENCOUNTER — Encounter: Payer: Self-pay | Admitting: Cardiology

## 2022-10-29 ENCOUNTER — Other Ambulatory Visit: Payer: Self-pay | Admitting: Family

## 2022-10-31 ENCOUNTER — Ambulatory Visit: Payer: BC Managed Care – PPO | Admitting: Family

## 2022-10-31 VITALS — BP 120/78 | HR 85 | Resp 19 | Ht 67.0 in | Wt 248.2 lb

## 2022-10-31 DIAGNOSIS — I8392 Asymptomatic varicose veins of left lower extremity: Secondary | ICD-10-CM | POA: Diagnosis not present

## 2022-10-31 DIAGNOSIS — L720 Epidermal cyst: Secondary | ICD-10-CM | POA: Diagnosis not present

## 2022-10-31 DIAGNOSIS — E785 Hyperlipidemia, unspecified: Secondary | ICD-10-CM | POA: Diagnosis not present

## 2022-10-31 DIAGNOSIS — F418 Other specified anxiety disorders: Secondary | ICD-10-CM

## 2022-10-31 DIAGNOSIS — Z720 Tobacco use: Secondary | ICD-10-CM | POA: Diagnosis not present

## 2022-10-31 LAB — CBC WITH DIFFERENTIAL/PLATELET
Basophils Absolute: 0 10*3/uL (ref 0.0–0.1)
Basophils Relative: 0.6 % (ref 0.0–3.0)
Eosinophils Absolute: 0.1 10*3/uL (ref 0.0–0.7)
Eosinophils Relative: 1.8 % (ref 0.0–5.0)
HCT: 42.3 % (ref 36.0–46.0)
Hemoglobin: 14.3 g/dL (ref 12.0–15.0)
Lymphocytes Relative: 31.6 % (ref 12.0–46.0)
Lymphs Abs: 1.8 10*3/uL (ref 0.7–4.0)
MCHC: 33.8 g/dL (ref 30.0–36.0)
MCV: 94.3 fl (ref 78.0–100.0)
Monocytes Absolute: 0.4 10*3/uL (ref 0.1–1.0)
Monocytes Relative: 6.5 % (ref 3.0–12.0)
Neutro Abs: 3.4 10*3/uL (ref 1.4–7.7)
Neutrophils Relative %: 59.5 % (ref 43.0–77.0)
Platelets: 323 10*3/uL (ref 150.0–400.0)
RBC: 4.49 Mil/uL (ref 3.87–5.11)
RDW: 14 % (ref 11.5–15.5)
WBC: 5.8 10*3/uL (ref 4.0–10.5)

## 2022-10-31 LAB — COMPREHENSIVE METABOLIC PANEL
ALT: 22 U/L (ref 0–35)
AST: 21 U/L (ref 0–37)
Albumin: 4.1 g/dL (ref 3.5–5.2)
Alkaline Phosphatase: 70 U/L (ref 39–117)
BUN: 10 mg/dL (ref 6–23)
CO2: 32 mEq/L (ref 19–32)
Calcium: 9.4 mg/dL (ref 8.4–10.5)
Chloride: 104 mEq/L (ref 96–112)
Creatinine, Ser: 0.64 mg/dL (ref 0.40–1.20)
GFR: 94.91 mL/min (ref >60.00)
Glucose, Bld: 92 mg/dL (ref 70–99)
Potassium: 4.7 mEq/L (ref 3.5–5.1)
Sodium: 141 mEq/L (ref 135–145)
Total Bilirubin: 0.5 mg/dL (ref 0.2–1.2)
Total Protein: 6.3 g/dL (ref 6.0–8.3)

## 2022-10-31 LAB — LIPID PANEL
Cholesterol: 139 mg/dL (ref 0–200)
HDL: 37.9 mg/dL — ABNORMAL LOW (ref >39.00)
LDL Cholesterol: 69 mg/dL (ref 0–99)
NonHDL: 101.47
Total CHOL/HDL Ratio: 4
Triglycerides: 161 mg/dL — ABNORMAL HIGH (ref 0.0–149.0)
VLDL: 32.2 mg/dL (ref 0.0–40.0)

## 2022-10-31 MED ORDER — ESCITALOPRAM OXALATE 10 MG PO TABS: 10.0000 mg | ORAL_TABLET | Freq: Every day | ORAL | 3 refills | Status: DC

## 2022-10-31 NOTE — Progress Notes (Signed)
Jenny Bartlett is a 62 y.o. female with the following history as recorded in EpicCare:  Patient Active Problem List   Diagnosis Date Noted   Coronary artery disease only minimal disease in LAD based on coronary CT angio 08/15/2022   Palpitations 03/18/2022   Snoring 03/18/2022   Atypical chest pain 03/18/2022   Ventricular bigeminy 03/18/2022   Dyslipidemia 03/18/2022   Cigarette smoker 01/08/2013    Current Outpatient Medications  Medication Sig Dispense Refill   metoprolol tartrate (LOPRESSOR) 25 MG tablet Take 1 tablet (25 mg total) by mouth 2 (two) times daily. 180 tablet 3   rosuvastatin (CRESTOR) 10 MG tablet Take 1 tablet (10 mg total) by mouth daily. 90 tablet 3   escitalopram (LEXAPRO) 10 MG tablet Take 1 tablet (10 mg total) by mouth daily. 90 tablet 3   ibuprofen (ADVIL) 200 MG tablet Take 800 mg by mouth 2 (two) times daily as needed for mild pain or moderate pain.     methocarbamol (ROBAXIN) 500 MG tablet TAKE 1 TABLET BY MOUTH TWICE A DAY AS NEEDED 30 tablet 0   No current facility-administered medications for this visit.    Allergies: Patient has no known allergies.  Past Medical History:  Diagnosis Date   Hypercholesteremia     Past Surgical History:  Procedure Laterality Date   ABDOMINAL HYSTERECTOMY     APPENDECTOMY     TONSILLECTOMY      Family History  Problem Relation Age of Onset   Cancer Mother    Stomach cancer Mother    Hypertension Father    Colon cancer Father     Social History   Tobacco Use   Smoking status: Every Day    Current packs/day: 0.50    Types: Cigarettes   Smokeless tobacco: Never  Substance Use Topics   Alcohol use: Not Currently    Subjective:   Concerned about "cyst" on right flank that is becoming more problematic; needs to get updated labs as well to follow up on start of Crestor;  Requesting refill on Lexapro- doing well;   Objective:  Vitals:   10/31/22 0801  BP: 120/78  Pulse: 85  Resp: 19  SpO2: 95%   Weight: 248 lb 3.2 oz (112.6 kg)  Height: 5\' 7"  (1.702 m)    General: Well developed, well nourished, in no acute distress  Skin : Warm and dry.  Head: Normocephalic and atraumatic  Lungs: Respirations unlabored; clear to auscultation bilaterally without wheeze, rales, rhonchi  CVS exam: normal rate and regular rhythm.  Neurologic: Alert and oriented; speech intact; face symmetrical; moves all extremities well; CNII-XII intact without focal deficit   Assessment:  1. Hyperlipidemia, unspecified hyperlipidemia type   2. Epidermal inclusion cyst   3. Varicose veins of left lower extremity, unspecified whether complicated   4. Tobacco abuse   5. Situational anxiety     Plan:  Check CMP, lipid panel; continue Crestor; Referral to general surgery; Referral to vascular surgery; Referral updated for tobacco screen; patient is actively trying to cut back;   No follow-ups on file.  Orders Placed This Encounter  Procedures   CBC with Differential/Platelet   Comp Met (CMET)   Lipid panel   Ambulatory referral to General Surgery    Referral Priority:   Routine    Referral Type:   Surgical    Referral Reason:   Specialty Services Required    Requested Specialty:   General Surgery    Number of Visits Requested:   1  Ambulatory referral to Vascular Surgery    Referral Priority:   Routine    Referral Type:   Surgical    Referral Reason:   Specialty Services Required    Requested Specialty:   Vascular Surgery    Number of Visits Requested:   1   Ambulatory Referral Lung Cancer Screening [facility]    Referral Priority:   Routine    Referral Type:   Consultation    Referral Reason:   Specialty Services Required    Number of Visits Requested:   1    Requested Prescriptions   Signed Prescriptions Disp Refills   escitalopram (LEXAPRO) 10 MG tablet 90 tablet 3    Sig: Take 1 tablet (10 mg total) by mouth daily.

## 2022-11-07 ENCOUNTER — Ambulatory Visit: Payer: BC Managed Care – PPO | Admitting: Orthopaedic Surgery

## 2022-11-12 ENCOUNTER — Ambulatory Visit: Payer: BC Managed Care – PPO | Admitting: Orthopaedic Surgery

## 2022-11-19 ENCOUNTER — Other Ambulatory Visit: Payer: Self-pay | Admitting: General Surgery

## 2022-11-19 DIAGNOSIS — L723 Sebaceous cyst: Secondary | ICD-10-CM | POA: Diagnosis not present

## 2022-11-19 DIAGNOSIS — L918 Other hypertrophic disorders of the skin: Secondary | ICD-10-CM | POA: Diagnosis not present

## 2022-12-11 ENCOUNTER — Encounter: Payer: Self-pay | Admitting: Orthopedic Surgery

## 2022-12-11 ENCOUNTER — Other Ambulatory Visit (INDEPENDENT_AMBULATORY_CARE_PROVIDER_SITE_OTHER): Payer: Self-pay

## 2022-12-11 ENCOUNTER — Ambulatory Visit: Payer: BC Managed Care – PPO | Admitting: Orthopedic Surgery

## 2022-12-11 VITALS — BP 143/85 | HR 87 | Ht 67.0 in | Wt 248.5 lb

## 2022-12-11 DIAGNOSIS — M5416 Radiculopathy, lumbar region: Secondary | ICD-10-CM

## 2022-12-11 NOTE — Progress Notes (Signed)
Orthopedic Spine Surgery Office Note  Assessment: Patient is a 62 y.o. female with low back pain that radiates into the right lateral thigh. 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   ___________________________________________________________________________   History:  Patient is a 62 y.o. female who presents today for lumbar spine.  Patient has had over a year of low back pain that radiates into the right lower extremity.  She feels it along the lateral aspect of the hip and lateral thigh.  It does not radiate past the mid thigh.  She has no pain rating into the left lower extremity.  There is no trauma or injury that preceded the onset of pain.  She notes the leg pain mostly when up and walking.  She does not notice the leg pain when she is sitting.  Her back pain is more significant than her leg pain.  She notes the back pain is particular bad if she is lifting or carrying anything.  She gives the example of lifting up a litter box.  She notes that the pain has gotten progressively worse over time.   Weakness: Denies Symptoms of imbalance: Denies Paresthesias and numbness: Yes, along the lateral aspect of the right hip and thigh.  No other numbness or paresthesias Bowel or bladder incontinence: Denies Saddle anesthesia: Denies  Treatments tried: Tylenol, ibuprofen, lumbar steroid injection  Review of systems: Denies fevers and chills, night sweats,  unexplained weight loss, history of cancer.  Has had pain that wakes her at night  Past medical history: HLD Anxiety  Allergies: NKDA  Past surgical history:  Hysterectomy Appendectomy Tonsillectomy  Social history: Reports use of nicotine product (smoking, vaping, patches, smokeless) Alcohol use: Denies Denies recreational drug use   Physical Exam:  BMI of 38.9  General: no acute distress, appears stated age Neurologic: alert, answering questions appropriately, following commands Respiratory: unlabored breathing on room air, symmetric chest rise Psychiatric: appropriate affect, normal cadence to speech   MSK (spine):  -Strength exam      Left  Right EHL    5/5  5/5 TA    5/5  5/5 GSC    5/5  5/5 Knee extension  5/5  5/5 Hip flexion   5/5  5/5  -Sensory exam    Sensation intact to light touch in L3-S1 nerve distributions of bilateral lower extremities  -Achilles DTR: 2/4 on the left, 2/4 on the right -Patellar tendon DTR: 2/4 on the left, 2/4 on the right  -Straight leg raise: Negative bilaterally -Femoral nerve stretch test: Negative bilaterally -Clonus: no beats bilaterally  -Left hip exam: No pain through range of motion, negative Stinchfield, negative FABER -Right hip exam: No pain through range of motion, negative Stinchfield, negative FABER  Imaging: XRs of the lumbar spine from 12/11/2022 were independently reviewed and interpreted, showing disc at loss at L2/3, L3/4, and L4/5.  Anterior osteophyte formation seen at L2/3 and L3/4. No fracture or dislocation seen.  No evidence of instability on flexion/extension views.  MRI of the lumbar spine from 03/12/2021 was independently reviewed and interpreted, showing multiple levels with DDD. Disc bulge with lateral recess stenosis at L3/4 on the right. No other significant central, lateral recess, or foraminal stenosis.    Patient name: Jenny Bartlett Patient MRN: [id number] Date of visit: 12/11/22

## 2022-12-13 ENCOUNTER — Ambulatory Visit
Admission: EM | Admit: 2022-12-13 | Discharge: 2022-12-13 | Disposition: A | Discharge status: Home / Self Care | Payer: BC Managed Care – PPO | Attending: Urgent Care | Admitting: Urgent Care

## 2022-12-13 DIAGNOSIS — Z23 Encounter for immunization: Secondary | ICD-10-CM | POA: Diagnosis not present

## 2022-12-13 DIAGNOSIS — M79641 Pain in right hand: Secondary | ICD-10-CM | POA: Diagnosis not present

## 2022-12-13 DIAGNOSIS — S61411A Laceration without foreign body of right hand, initial encounter: Secondary | ICD-10-CM

## 2022-12-13 MED ORDER — TETANUS-DIPHTH-ACELL PERTUSSIS 5-2.5-18.5 LF-MCG/0.5 IM SUSY
0.5000 mL | PREFILLED_SYRINGE | Freq: Once | INTRAMUSCULAR | Status: AC
  Administered 2022-12-13: 0.5 mL via INTRAMUSCULAR

## 2022-12-13 NOTE — ED Triage Notes (Signed)
Pt reports laceration in palm of right hand with a piece of metal form a tv she was moving 1 hr ago.   Pt can't remember the last time she had a Tdap.

## 2022-12-13 NOTE — ED Provider Notes (Signed)
[facility]  Note:  This document was prepared using Conservation officer, historic buildings and may include unintentional dictation errors.  MRN: [id number] DOB: [birth year]  Subjective:   Jenny Bartlett is a 62 y.o. female presenting for suffering a right hand laceration.  Patient was moving a TV and the underside of her hand got caught against a piece of metal.  This led to the laceration.  She did rinse her hand and apply antibiotic ointment.  Came straight to the clinic.  Cannot recall last Tdap.  Has not lost range of motion or sensation.  No current facility-administered medications for this encounter.  Current Outpatient Medications:    escitalopram (LEXAPRO) 10 MG tablet, Take 1 tablet (10 mg total) by mouth daily., Disp: 90 tablet, Rfl: 3   metoprolol tartrate (LOPRESSOR) 25 MG tablet, Take 1 tablet (25 mg total) by mouth 2 (two) times daily., Disp: 180 tablet, Rfl: 3   rosuvastatin (CRESTOR) 10 MG tablet, Take 1 tablet (10 mg total) by mouth daily., Disp: 90 tablet, Rfl: 3   No Known Allergies  Past Medical History:  Diagnosis Date   Hypercholesteremia      Past Surgical History:  Procedure Laterality Date   ABDOMINAL HYSTERECTOMY     APPENDECTOMY     TONSILLECTOMY      Family History  Problem Relation Age of Onset   Cancer Mother    Stomach cancer Mother    Hypertension Father    Colon cancer Father     Social History   Tobacco Use   Smoking status: Every Day    Current packs/day: 0.50    Types: Cigarettes   Smokeless tobacco: Never  Vaping Use   Vaping status: Never Used  Substance Use Topics   Alcohol use: Not Currently   Drug use: Never    ROS   Objective:   Vitals: BP (!) 148/87 (BP Location: Left Arm)   Pulse 92   Temp 98 F (36.7 C) (Oral)   Resp 18   SpO2 95%   Physical Exam Constitutional:      General: She is not in acute distress.    Appearance: Normal appearance. She is well-developed. She is not  ill-appearing, toxic-appearing or diaphoretic.  HENT:     Head: Normocephalic and atraumatic.     Nose: Nose normal.     Mouth/Throat:     Mouth: Mucous membranes are moist.  Eyes:     General: No scleral icterus.       Right eye: No discharge.        Left eye: No discharge.     Extraocular Movements: Extraocular movements intact.  Cardiovascular:     Rate and Rhythm: Normal rate.  Pulmonary:     Effort: Pulmonary effort is normal.  Musculoskeletal:       Hands:  Skin:    General: Skin is warm and dry.  Neurological:     General: No focal deficit present.     Mental Status: She is alert and oriented to person, place, and time.  Psychiatric:        Mood and Affect: Mood normal.        Behavior: Behavior normal.     PROCEDURE NOTE: laceration repair Verbal consent obtained from patient.  Local anesthesia with 8cc Lidocaine 2% with epinephrine.  Wound explored for tendon, ligament damage. Wound scrubbed with soap and water and rinsed. Wound closed with #6 4-0 Ethilon (alternating 3 horizontal mattress, 3 simple interrupted) sutures.  Wound cleansed and dressed.  Full range of motion prior to and following laceration repair.  Tdap updated in clinic.  Assessment and Plan :   PDMP not reviewed this encounter.  1. Right hand pain   2. Laceration of right hand without foreign body, initial encounter   3. Need for diphtheria-tetanus-pertussis (Tdap) vaccine    Laceration repaired successfully. Wound care reviewed. Recommended Tylenol and/or ibuprofen for pain control. Return-to-clinic precautions discussed, patient verbalized understanding. Otherwise, follow up in 10 days for suture removal. Counseled patient on potential for adverse effects with medications prescribed/recommended today, ER and return-to-clinic precautions discussed, patient verbalized understanding.    Wallis Bamberg, [place] 12/14/22 [phone]

## 2022-12-13 NOTE — Discharge Instructions (Signed)
Take the dressing off tomorrow. Let the wound breathe when you can. Apply ointment only to the abrasion area. Otherwise, keep the wound clean and dry. If you are going to be active with your hands, then cover the wound to prevent infection. If you get redness, drainage, swelling, pain, fever, those are signs of infection, need to be seen again.

## 2022-12-22 ENCOUNTER — Ambulatory Visit
Admission: EM | Admit: 2022-12-22 | Discharge: 2022-12-22 | Disposition: A | Discharge status: Home / Self Care | Payer: BC Managed Care – PPO

## 2022-12-22 NOTE — ED Triage Notes (Signed)
Pt presents for suture removal in right hand. Denies redness, drainage, chill, fever, pain.

## 2022-12-29 ENCOUNTER — Other Ambulatory Visit: Payer: Self-pay | Admitting: *Deleted

## 2022-12-29 DIAGNOSIS — I83892 Varicose veins of left lower extremities with other complications: Secondary | ICD-10-CM

## 2023-01-06 ENCOUNTER — Ambulatory Visit (HOSPITAL_COMMUNITY): Payer: BC Managed Care – PPO | Attending: Vascular Surgery

## 2023-01-06 ENCOUNTER — Other Ambulatory Visit: Payer: Self-pay | Admitting: Family

## 2023-04-17 DIAGNOSIS — M76822 Posterior tibial tendinitis, left leg: Secondary | ICD-10-CM | POA: Diagnosis not present

## 2023-04-17 DIAGNOSIS — M79671 Pain in right foot: Secondary | ICD-10-CM | POA: Diagnosis not present

## 2023-04-17 DIAGNOSIS — M25572 Pain in left ankle and joints of left foot: Secondary | ICD-10-CM | POA: Diagnosis not present

## 2023-04-17 DIAGNOSIS — M722 Plantar fascial fibromatosis: Secondary | ICD-10-CM | POA: Diagnosis not present

## 2023-05-15 DIAGNOSIS — S93491A Sprain of other ligament of right ankle, initial encounter: Secondary | ICD-10-CM | POA: Diagnosis not present

## 2023-05-15 DIAGNOSIS — M722 Plantar fascial fibromatosis: Secondary | ICD-10-CM | POA: Diagnosis not present

## 2023-05-15 DIAGNOSIS — M76822 Posterior tibial tendinitis, left leg: Secondary | ICD-10-CM | POA: Diagnosis not present

## 2023-07-21 DIAGNOSIS — L820 Inflamed seborrheic keratosis: Secondary | ICD-10-CM | POA: Diagnosis not present

## 2023-07-21 DIAGNOSIS — L821 Other seborrheic keratosis: Secondary | ICD-10-CM | POA: Diagnosis not present

## 2023-07-23 DIAGNOSIS — I87393 Chronic venous hypertension (idiopathic) with other complications of bilateral lower extremity: Secondary | ICD-10-CM | POA: Diagnosis not present

## 2023-07-23 DIAGNOSIS — M7989 Other specified soft tissue disorders: Secondary | ICD-10-CM | POA: Diagnosis not present

## 2023-07-23 DIAGNOSIS — M79605 Pain in left leg: Secondary | ICD-10-CM | POA: Diagnosis not present

## 2023-07-23 DIAGNOSIS — M79662 Pain in left lower leg: Secondary | ICD-10-CM | POA: Diagnosis not present

## 2023-07-23 DIAGNOSIS — R600 Localized edema: Secondary | ICD-10-CM | POA: Diagnosis not present

## 2023-07-23 DIAGNOSIS — M79661 Pain in right lower leg: Secondary | ICD-10-CM | POA: Diagnosis not present

## 2023-08-01 ENCOUNTER — Encounter (HOSPITAL_BASED_OUTPATIENT_CLINIC_OR_DEPARTMENT_OTHER): Payer: Self-pay | Admitting: Emergency Medicine

## 2023-08-01 ENCOUNTER — Emergency Department (HOSPITAL_BASED_OUTPATIENT_CLINIC_OR_DEPARTMENT_OTHER)
Admission: EM | Admit: 2023-08-01 | Discharge: 2023-08-01 | Disposition: A | Discharge status: Home / Self Care | Attending: Emergency Medicine | Admitting: Emergency Medicine

## 2023-08-01 ENCOUNTER — Emergency Department (HOSPITAL_BASED_OUTPATIENT_CLINIC_OR_DEPARTMENT_OTHER)

## 2023-08-01 DIAGNOSIS — Z79899 Other long term (current) drug therapy: Secondary | ICD-10-CM | POA: Diagnosis not present

## 2023-08-01 DIAGNOSIS — I10 Essential (primary) hypertension: Secondary | ICD-10-CM | POA: Diagnosis not present

## 2023-08-01 DIAGNOSIS — M25562 Pain in left knee: Secondary | ICD-10-CM | POA: Insufficient documentation

## 2023-08-01 DIAGNOSIS — I2510 Atherosclerotic heart disease of native coronary artery without angina pectoris: Secondary | ICD-10-CM | POA: Diagnosis not present

## 2023-08-01 DIAGNOSIS — F17210 Nicotine dependence, cigarettes, uncomplicated: Secondary | ICD-10-CM | POA: Diagnosis not present

## 2023-08-01 DIAGNOSIS — M7652 Patellar tendinitis, left knee: Secondary | ICD-10-CM | POA: Diagnosis not present

## 2023-08-01 DIAGNOSIS — S838X2A Sprain of other specified parts of left knee, initial encounter: Secondary | ICD-10-CM | POA: Diagnosis not present

## 2023-08-01 MED ORDER — CELECOXIB 200 MG PO CAPS: 200.0000 mg | ORAL_CAPSULE | Freq: Two times a day (BID) | ORAL | 0 refills | Status: DC | PRN

## 2023-08-01 NOTE — ED Triage Notes (Signed)
 Pt felt a pop in LT knee on Thur when she was trying to jog; pain continues, amb to triage with a limp

## 2023-08-01 NOTE — Discharge Instructions (Addendum)
 As discussed, x-ray did not show evidence of fracture or dislocation.  You do have some arthritis on the inside compartment of your knee where you are having pain.  Suspicious that you may have a meniscal injury.  Will send in anti-inflammatories to use for pain.  Continue to ice, elevate leg above the heart of the heart to help with any pain/inflammation.  Will attach information for orthopedic to follow-up with.  Please do not hesitate to return to emergency department if the worrisome signs and symptoms we discussed become apparent.

## 2023-08-01 NOTE — ED Provider Notes (Signed)
 [facility] Provider Note   CSN: [id number] Arrival date & time: 08/01/23  1405     History  Chief Complaint  Patient presents with   Knee Pain    Jenny Bartlett is a 63 y.o. female.   Knee Pain   62 year old female presents emergency department with complaints of left-sided knee pain.  States the knee pain began on Thursday when she was jogging to the bus stop try not to miss the bus.  Denies any known trauma/injury but developed pain on the inside of her knee when she was running.  States that since then, has had pain with certain movements.  Again, denies any falls.  Denies any weakness or sensory deficits in her left leg.  Denies any pain elsewhere.  Denies feelings of knee instability.  Presents emergency department further assessment/evaluation.  Past medical history significant for hypercholesterolemia, CAD  Home Medications Prior to Admission medications   Medication Sig Start Date End Date Taking? Authorizing Provider  escitalopram  (LEXAPRO ) 10 MG tablet Take 1 tablet (10 mg total) by mouth daily. 10/31/22   Adra Alanis, FNP  metoprolol  tartrate (LOPRESSOR ) 25 MG tablet Take 1 tablet (25 mg total) by mouth 2 (two) times daily. 08/20/22 11/18/22  Krasowski, Robert J, MD  rosuvastatin  (CRESTOR ) 10 MG tablet TAKE 1 TABLET(10 MG) BY MOUTH DAILY 01/06/23   Adra Alanis, FNP      Allergies    Patient has no known allergies.    Review of Systems   Review of Systems  All other systems reviewed and are negative.   Physical Exam Updated Vital Signs BP (!) 144/92 (BP Location: Right Arm)   Pulse (!) 106   Temp 98.1 F (36.7 C) (Oral)   Resp (!) 24   Ht 5\' 7"  (1.702 m)   Wt 111.1 kg   SpO2 96%   BMI 38.37 kg/m  Physical Exam Vitals and nursing note reviewed.  Constitutional:      General: She is not in acute distress.    Appearance: She is well-developed.  HENT:     Head: Normocephalic and  atraumatic.  Eyes:     Conjunctiva/sclera: Conjunctivae normal.  Cardiovascular:     Rate and Rhythm: Normal rate and regular rhythm.     Heart sounds: No murmur heard. Pulmonary:     Effort: Pulmonary effort is normal. No respiratory distress.     Breath sounds: Normal breath sounds.  Abdominal:     Palpations: Abdomen is soft.     Tenderness: There is no abdominal tenderness.  Musculoskeletal:        General: No swelling.     Cervical back: Neck supple.     Comments: Full range of motion of left knee.  No obvious medial lateral joint line tenderness.  No tenderness of patella.  No obvious external swelling.  No overlying erythema, palpable fluctuance/induration.  Pedal posttibial pulses 2+ bilaterally.  Muscle strength 5 out of 5 bilateral lower extremities.  Skin:    General: Skin is warm and dry.     Capillary Refill: Capillary refill takes less than 2 seconds.  Neurological:     Mental Status: She is alert.  Psychiatric:        Mood and Affect: Mood normal.     ED Results / Procedures / Treatments   Labs (all labs ordered are listed, but only abnormal results are displayed) Labs Reviewed - No data to display  EKG None  Radiology No  results found.  Procedures Procedures    Medications Ordered in ED Medications - No data to display  ED Course/ Medical Decision Making/ A&P                                 Medical Decision Making Amount and/or Complexity of Data Reviewed Radiology: ordered.  Risk Prescription drug management.   This patient presents to the ED for concern of knee pain, this involves an extensive number of treatment options, and is a complaint that carries with it a high risk of complications and morbidity.  The differential diagnosis includes fracture, strain/sprain, dislocation, ligament/tendon injury, neurovasc compromise, septic arthritis, osteoarthritis, gout, other   Co morbidities that complicate the patient evaluation  See  HPI   Additional history obtained:  Additional history obtained from EMR External records from outside source obtained and reviewed including hospital records   Lab Tests:  N/a   Imaging Studies ordered:  I ordered imaging studies including left knee x-ray I independently visualized and interpreted imaging which showed no acute fracture or dislocation.  Medial joint narrowing. I agree with the radiologist interpretation   Cardiac Monitoring: / EKG:  N/a   Consultations Obtained:  N/a   Problem List / ED Course / Critical interventions / Medication management  Left knee pain Reevaluation of the patient  showed that the patient stayed the same I have reviewed the patients home medicines and have made adjustments as needed   Social Determinants of Health:  Cigarette use.  Denies illicit drug use.   Test / Admission - Considered:  Left knee pain Vitals signs significant for hypertension. Otherwise within normal range and stable throughout visit. Imaging studies significant for: See above 63 year old female presents emergency department with complaints of left-sided knee pain.  States the knee pain began on Thursday when she was jogging to the bus stop try not to miss the bus.  Denies any known trauma/injury but developed pain on the inside of her knee when she was running.  States that since then, has had pain with certain movements.  Again, denies any falls.  Denies any weakness or sensory deficits in her left leg.  Denies any pain elsewhere.  Denies feelings of knee instability.  Presents emergency department further assessment/evaluation. On exam, no obvious bony tenderness of the left knee.  No obvious joint laxity appreciated anterior/posterior drawer, valgus/varus stress.  No pulse deficits to suggest ischemic limb.  No overlying skin changes concerning for secondary infectious process.  No lower extremity edema concerning for DVT.  X-ray obtained by triage staff  which is negative for any acute osseous abnormality but with some joint space narrowing medial compartment of left knee.  Concern for meniscal/ligamentous injury given patient's hearing popping sensation does have medial joint pain ever since then.  Will place patient in knee brace, recommend symptomatic therapy and follow-up with orthopedic.  Patient already has established care with [facility].  Treatment plan discussed with patient and she acknowledged understanding was agreeable to said plan.  Patient overall well-appearing, afebrile in no acute distress. Worrisome signs and symptoms were discussed with the patient, and the patient acknowledged understanding to return to the ED if noticed. Patient was stable upon discharge.          Final Clinical Impression(s) / ED Diagnoses Final diagnoses:  None    Rx / DC Orders ED Discharge Orders     None  Carpendale Butter, [place] 08/01/23 1536    Hershel Los, MD 08/02/23 [phone]

## 2023-08-14 DIAGNOSIS — M25562 Pain in left knee: Secondary | ICD-10-CM | POA: Diagnosis not present

## 2023-09-01 ENCOUNTER — Other Ambulatory Visit: Payer: Self-pay | Admitting: Family

## 2023-09-01 ENCOUNTER — Ambulatory Visit: Admitting: Family

## 2023-09-01 VITALS — BP 124/82 | HR 78 | Ht 67.0 in | Wt 250.6 lb

## 2023-09-01 DIAGNOSIS — N6310 Unspecified lump in the right breast, unspecified quadrant: Secondary | ICD-10-CM

## 2023-09-01 NOTE — Progress Notes (Signed)
  Jenny Bartlett is a 63 y.o. 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     celecoxib  (CELEBREX ) 200 MG capsule Take 1 capsule (200 mg total) by mouth 2 (two) times daily as needed. 30 capsule 0   escitalopram  (LEXAPRO ) 10 MG tablet Take 1 tablet (10 mg total) by mouth daily. 90 tablet 3   rosuvastatin  (CRESTOR ) 10 MG tablet TAKE 1 TABLET(10 MG) BY MOUTH DAILY 90 tablet 3   metoprolol  tartrate (LOPRESSOR ) 25 MG tablet Take 1 tablet (25 mg total) by mouth 2 (two) times daily. (Patient not taking: Reported on 09/01/2023) 180 tablet 3   No current facility-administered medications for this visit.    Allergies: Patient has no known allergies.  Past Medical History:  Diagnosis Date   Hypercholesteremia     Past Surgical History:  Procedure Laterality Date   ABDOMINAL HYSTERECTOMY     APPENDECTOMY     TONSILLECTOMY      Family History  Problem Relation Age of Onset   Cancer Mother    Stomach cancer Mother    Hypertension Father    Colon cancer Father     Social History   Tobacco Use   Smoking status: Every Day    Current packs/day: 0.50    Types: Cigarettes   Smokeless tobacco: Never  Substance Use Topics   Alcohol use: Not Currently    Subjective:   Accompanied by her husband; concerned about lump in her breast; feels that second area has developed;  Last mammogram was in December 2023; history of fibrocystic breast;  Is currently on Amoxicillin for upcoming dental procedure; 875 mg bid x 10  days;   Objective:  Vitals:   09/01/23 1308  BP: 124/82  Pulse: 78  SpO2: 95%  Weight: 250 lb 9.6 oz (113.7 kg)  Height: 5' 7 (1.702 m)    General: Well developed, well nourished, in no acute distress  Skin : Warm and dry.  Head: Normocephalic and atraumatic  Lungs: Respirations unlabored;  Neurologic: Alert and oriented; speech intact; face symmetrical; moves all extremities well; CNII-XII intact without focal deficit  Breast exam- areas of concern noted in right breast at 3:00 position and 6:00 position;  Assessment:  1. Mass of right breast, unspecified quadrant     Plan:  ? Cystic areas; patient is already taking oral antibiotics for upcoming dental procedure; will update bilateral diagnostic mammogram; follow up to be determined;   No follow-ups on file.  Orders Placed This Encounter  Procedures   MM 3D DIAGNOSTIC MAMMOGRAM BILATERAL BREAST    Standing Status:   Future    Expiration Date:   08/31/2024    Reason for Exam (SYMPTOM  OR DIAGNOSIS REQUIRED):   right breast lump    Preferred imaging location?:   [facility]    Requested Prescriptions    No prescriptions requested or ordered in this encounter

## 2023-09-08 ENCOUNTER — Ambulatory Visit
Admission: RE | Admit: 2023-09-08 | Discharge: 2023-09-08 | Disposition: A | Discharge status: Home / Self Care | Source: Ambulatory Visit | Attending: Family | Admitting: Family

## 2023-09-08 ENCOUNTER — Inpatient Hospital Stay
Admission: RE | Admit: 2023-09-08 | Discharge: 2023-09-08 | Discharge status: Home / Self Care | Source: Ambulatory Visit | Attending: Family | Admitting: Family

## 2023-09-08 ENCOUNTER — Ambulatory Visit: Payer: Self-pay | Admitting: Family

## 2023-09-08 DIAGNOSIS — N6310 Unspecified lump in the right breast, unspecified quadrant: Secondary | ICD-10-CM

## 2023-09-08 DIAGNOSIS — N6011 Diffuse cystic mastopathy of right breast: Secondary | ICD-10-CM | POA: Diagnosis not present

## 2023-09-18 ENCOUNTER — Encounter

## 2023-09-18 ENCOUNTER — Other Ambulatory Visit

## 2024-01-03 ENCOUNTER — Ambulatory Visit
Admission: EM | Admit: 2024-01-03 | Discharge: 2024-01-03 | Disposition: A | Discharge status: Home / Self Care | Attending: Family Medicine | Admitting: Family Medicine

## 2024-01-03 ENCOUNTER — Other Ambulatory Visit: Payer: Self-pay

## 2024-01-03 DIAGNOSIS — L02416 Cutaneous abscess of left lower limb: Secondary | ICD-10-CM | POA: Diagnosis not present

## 2024-01-03 MED ORDER — DOXYCYCLINE HYCLATE 100 MG PO CAPS: 100.0000 mg | ORAL_CAPSULE | Freq: Two times a day (BID) | ORAL | 0 refills | Status: DC

## 2024-01-03 NOTE — ED Provider Notes (Addendum)
 [facility]    CSN: [id number] Arrival date & time: 01/03/24  0919      History   Chief Complaint Chief Complaint  Patient presents with   Abscess    HPI Jenny Bartlett is a 63 y.o. female presents for abscess.  Patient reports she sustained a scratch to her left lower leg about 3 weeks ago and since then has been noticed some swelling redness to the area.  States it bleeds occasionally but no purulent drainage.  No fevers or chills.  No history of MRSA but does state she has a history of cysts.  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      Home Medications    Prior to Admission medications   Medication Sig Start Date End Date Taking? Authorizing Provider  doxycycline (VIBRAMYCIN) 100 MG capsule Take 1 capsule (100 mg total) by mouth 2 (two) times daily. 01/03/24  Yes Takhia Spoon, Jodi R, NP  amoxicillin (AMOXIL) 875 MG tablet Take 875 mg by mouth 2 (two) times daily. 08/30/23   [provider]  celecoxib  (CELEBREX ) 200 MG capsule Take 1 capsule (200 mg total) by mouth 2 (two) times daily as needed. 08/01/23   Silver Wonda LABOR, PA  escitalopram  (LEXAPRO ) 10 MG tablet Take 1 tablet (10 mg total) by mouth daily. 10/31/22   Jason Leita Repine, FNP  metoprolol  tartrate (LOPRESSOR ) 25 MG tablet Take 1 tablet (25 mg total) by mouth 2 (two) times daily. Patient not taking: Reported on 09/01/2023 08/20/22 09/01/23   Krasowski, Robert J, MD  rosuvastatin  (CRESTOR ) 10 MG tablet TAKE 1 TABLET(10 MG) BY MOUTH DAILY 01/06/23   Jason Leita Repine, FNP    Family History Family History  Problem Relation Age of Onset   Cancer Mother    Stomach cancer Mother    Hypertension Father    Colon cancer Father     Social History Social History   Tobacco Use   Smoking status: Every Day    Current packs/day: 0.50    Types: Cigarettes   Smokeless tobacco: Never  Vaping Use   Vaping status: Never Used  Substance Use Topics   Alcohol use: Not Currently   Drug use: Never     Allergies   Patient has no known allergies.   Review of Systems Review of Systems  Skin:  Positive for wound.     Physical Exam Triage Vital Signs ED Triage Vitals  Encounter Vitals Group     BP 01/03/24 0958 (!) 152/89     Girls Systolic BP Percentile --      Girls Diastolic BP Percentile --      Boys Systolic BP Percentile --      Boys Diastolic BP Percentile --      Pulse Rate 01/03/24 0958 96     Resp 01/03/24 0958 18     Temp 01/03/24 0958  97.9 F (36.6 C)     Temp Source 01/03/24 0958 Oral     SpO2 01/03/24 0958 92 %     Weight --      Height --      Head Circumference --      Peak Flow --      Pain Score 01/03/24 0956 0     Pain Loc --      Pain Education --      Exclude from Growth Chart --    No data found.  Updated Vital Signs BP (!) 152/89   Pulse 96   Temp 97.9 F (36.6 C) (Oral)   Resp 18   SpO2 92%   Visual Acuity Right Eye Distance:   Left Eye Distance:   Bilateral Distance:    Right Eye Near:   Left Eye Near:    Bilateral Near:     Physical Exam Vitals and nursing note reviewed.  Constitutional:      General: She is not in acute distress.    Appearance: Normal appearance. She is not ill-appearing.  HENT:     Head: Normocephalic and atraumatic.  Eyes:     Pupils: Pupils are equal, round, and reactive to light.  Cardiovascular:     Rate and Rhythm: Normal rate.   Pulmonary:     Effort: Pulmonary effort is normal.  Skin:    General: Skin is warm and dry.         Comments: There is a 0.25 x 0.25 indurated nonfluctuant abscess to the anterior mid left shin.  Tender to palpation.  No active drainage or bleeding.  Mild erythema.  Neurological:     General: No focal deficit present.     Mental Status: She is alert and oriented to person, place, and time.  Psychiatric:        Mood and Affect: Mood normal.        Behavior: Behavior normal.      UC Treatments / Results  Labs (all labs ordered are listed, but only abnormal results are displayed) Labs Reviewed - No data to display  Comp Met (CMET) Order: [id number]  Status: Final result     Next appt: None     Dx: Hyperlipidemia, unspecified hyperlipi...   Test Result Released: Yes (seen)     Messages: Seen   0 Result Notes     1 Patient Communication          Component Ref Range & Units (hover) 1 yr ago (10/31/22) 1 yr ago (03/25/22) 1 yr ago (03/25/22) 1 yr ago (03/25/22) 1 yr ago (02/17/22) 2 yr ago (11/12/21) 2 yr ago (02/08/21)  Sodium 141 140 R   141 R 139 140  Potassium 4.7 5.2 R   4.8 R 4.7 4.8  Chloride 104 99 R   105 R 101 104  CO2 32 23 R   29 R 31 29  Glucose, Bld 92 112 High    96 CM 86 98  BUN 10 12 R   8 R 13 17  Creatinine, Ser 0.64 0.73 R   0.72 R 0.69 0.75  Total Bilirubin 0.5     0.5 0.4  Alkaline Phosphatase 70     74 71  AST 21  24 R   22 13  ALT 22   29 R  28 18  Total Protein 6.3     7.0 7.0  Albumin 4.1     4.3 4.5  GFR 94.91  93.83 CM 86.54 CM  Comment: Calculated using the CKD-EPI Creatinine Equation (2021)  Calcium  9.4 10.2 R   9.5 R 9.8 9.9  Resulting Agency [place] [facility] [place] [facility]        Specimen Collected: 10/31/22 08:58 Last Resulted: 10/31/22 14:35    EKG   Radiology No results found.  Procedures Procedures (including critical care time)  Medications Ordered in  UC Medications - No data to display  Initial Impression / Assessment and Plan / UC Course  I have reviewed the triage vital signs and the nursing notes.  Pertinent labs & imaging results that were available during my care of the patient were reviewed by me and considered in my medical decision making (see chart for details).     Will start doxycycline twice daily and encouraged warm compresses frequently.  PCP follow-up if symptoms do not improve.  ER precautions reviewed Final Clinical Impressions(s) / UC Diagnoses   Final diagnoses:  Abscess of left lower leg     Discharge Instructions      Start doxycycline twice daily for 10 days.  Warm compresses to the area frequently as needed.  Please follow-up with your PCP if your symptoms do not improve.  Please go to the ER for any worsening symptoms.  Hope you feel better soon!    ED Prescriptions     Medication Sig Dispense Auth. Provider   doxycycline (VIBRAMYCIN) 100 MG capsule Take 1 capsule (100 mg total) by mouth 2 (two) times daily. 20 capsule [place] Schildt, Jodi R, NP      PDMP not reviewed this encounter.   Loreda Myla SAUNDERS, NP 01/03/24 1026    Loreda Myla SAUNDERS, NP 01/03/24 1026

## 2024-01-03 NOTE — ED Triage Notes (Signed)
 Pt c/o abscess on anterior of right lower [id number]. The abscess is approx the size of a nickel, it's scabbed and a little pink.

## 2024-01-03 NOTE — Discharge Instructions (Addendum)
 Start doxycycline twice daily for 10 days.  Warm compresses to the area frequently as needed.  Please follow-up with your PCP if your symptoms do not improve.  Please go to the ER for any worsening symptoms.  Hope you feel better soon!

## 2024-01-08 ENCOUNTER — Other Ambulatory Visit: Payer: Self-pay | Admitting: Student

## 2024-01-08 MED ORDER — ESCITALOPRAM OXALATE 10 MG PO TABS: 10.0000 mg | ORAL_TABLET | Freq: Every day | ORAL | 3 refills | Status: AC

## 2024-01-08 NOTE — Telephone Encounter (Signed)
 Copied from CRM [id number]. Topic: Clinical - Medication Refill >> Jan 08, 2024 10:30 AM Rosina BIRCH wrote: Medication: escitalopram  (LEXAPRO ) 10 MG tablet  Has the patient contacted their pharmacy? Yes (Agent: If no, request that the patient contact the pharmacy for the refill. If patient does not wish to contact the pharmacy document the reason why and proceed with request.) (Agent: If yes, when and what did the pharmacy advise?)  This is the patient's preferred pharmacy:  [id number] [place] [place], [place] - [address] [place] [address] Phone: [phone] Fax: [phone]  Is this the correct pharmacy for this prescription? Yes If no, delete pharmacy and type the correct one.   Has the prescription been filled recently? No  Is the patient out of the medication? Yes  Has the patient been seen for an appointment in the last year OR does the patient have an upcoming appointment? Yes  Can we respond through MyChart? Yes  Agent: Please be advised that Rx refills may take up to 3 business days. We ask that you follow-up with your pharmacy.

## 2024-03-21 NOTE — Progress Notes (Unsigned)
 No chief complaint on file.   Jenny Bartlett here for URI complaints.  Duration: {Numbers; 1-10:13787} {Time; day/wk/mo/yr:20843}  Associated symptoms: {URI Symptoms :210800001} Denies: {URI Symptoms :210800001} Treatment to date: *** Sick contacts: {yes/no:20286}  Past Medical History:  Diagnosis Date   Hypercholesteremia     Objective There were no vitals taken for this visit. General: Awake, alert, appears stated age HEENT: AT, , ears patent b/l and TM's neg, nares patent w/o discharge, pharynx pink and without exudates, MMM Neck: No masses or asymmetry Heart: RRR Lungs: CTAB, no accessory muscle use Psych: Age appropriate judgment and insight, normal mood and affect  No diagnosis found.  Continue to push fluids, practice good hand hygiene, cover mouth when coughing. F/u prn. If starting to experience fevers, shaking, or shortness of breath, seek immediate care. Pt voiced understanding and agreement to the plan.  Harlene LITTIE Jolly, DNP, AGNP-C 03/21/24 1:56 PM

## 2024-03-22 ENCOUNTER — Encounter: Payer: Self-pay | Admitting: Student

## 2024-03-22 ENCOUNTER — Ambulatory Visit: Admitting: Student

## 2024-03-22 ENCOUNTER — Ambulatory Visit (HOSPITAL_BASED_OUTPATIENT_CLINIC_OR_DEPARTMENT_OTHER)
Admission: RE | Admit: 2024-03-22 | Discharge: 2024-03-22 | Disposition: A | Discharge status: Home / Self Care | Source: Ambulatory Visit | Attending: Student | Admitting: Student

## 2024-03-22 VITALS — BP 144/82 | HR 81 | Temp 97.8°F | Resp 16 | Ht 67.0 in | Wt 251.8 lb

## 2024-03-22 DIAGNOSIS — R051 Acute cough: Secondary | ICD-10-CM | POA: Diagnosis not present

## 2024-03-22 DIAGNOSIS — J020 Streptococcal pharyngitis: Secondary | ICD-10-CM | POA: Diagnosis not present

## 2024-03-22 DIAGNOSIS — J441 Chronic obstructive pulmonary disease with (acute) exacerbation: Secondary | ICD-10-CM | POA: Diagnosis not present

## 2024-03-22 DIAGNOSIS — F17200 Nicotine dependence, unspecified, uncomplicated: Secondary | ICD-10-CM | POA: Insufficient documentation

## 2024-03-22 DIAGNOSIS — R0602 Shortness of breath: Secondary | ICD-10-CM | POA: Insufficient documentation

## 2024-03-22 MED ORDER — PREDNISONE 20 MG PO TABS: 40.0000 mg | ORAL_TABLET | Freq: Every day | ORAL | 0 refills | Status: AC

## 2024-03-22 MED ORDER — ALBUTEROL SULFATE HFA 108 (90 BASE) MCG/ACT IN AERS: 2.0000 | INHALATION_SPRAY | Freq: Four times a day (QID) | RESPIRATORY_TRACT | 2 refills | Status: AC | PRN

## 2024-03-22 MED ORDER — BENZONATATE 100 MG PO CAPS: 100.0000 mg | ORAL_CAPSULE | Freq: Three times a day (TID) | ORAL | 0 refills | Status: AC | PRN

## 2024-03-22 MED ORDER — AMOXICILLIN 500 MG PO CAPS: 500.0000 mg | ORAL_CAPSULE | Freq: Two times a day (BID) | ORAL | 0 refills | Status: AC

## 2024-03-22 MED ORDER — AZITHROMYCIN 250 MG PO TABS: ORAL_TABLET | ORAL | 0 refills | Status: DC

## 2024-03-22 MED ORDER — GUAIFENESIN ER 600 MG PO TB12: 1200.0000 mg | ORAL_TABLET | Freq: Two times a day (BID) | ORAL | 0 refills | Status: AC

## 2024-03-22 MED ORDER — IPRATROPIUM-ALBUTEROL 0.5-2.5 (3) MG/3ML IN SOLN: 3.0000 mL | Freq: Once | RESPIRATORY_TRACT | Status: AC

## 2024-03-23 ENCOUNTER — Ambulatory Visit: Payer: Self-pay | Admitting: Student

## 2024-03-23 LAB — POCT RAPID STREP A (OFFICE): Rapid Strep A Screen: POSITIVE — AB

## 2024-03-23 LAB — POC COVID19/FLU A&B COMBO
Covid Antigen, POC: NEGATIVE
Influenza A Antigen, POC: NEGATIVE
Influenza B Antigen, POC: NEGATIVE

## 2024-04-05 ENCOUNTER — Encounter: Payer: Self-pay | Admitting: Emergency Medicine

## 2024-04-15 ENCOUNTER — Encounter: Admitting: Student
